# Patient Record
Sex: Male | Born: 1975 | Race: Black or African American | Hispanic: No | Marital: Married | Smoking: Current every day smoker
Health system: Southern US, Community
[De-identification: ages and names within clinical notes are randomized; demographics above are authoritative.]

## PROBLEM LIST (undated history)

## (undated) ENCOUNTER — Emergency Department (HOSPITAL_BASED_OUTPATIENT_CLINIC_OR_DEPARTMENT_OTHER): Payer: No Typology Code available for payment source

## (undated) DIAGNOSIS — J45909 Unspecified asthma, uncomplicated: Secondary | ICD-10-CM

---

## 2001-03-28 ENCOUNTER — Emergency Department (HOSPITAL_COMMUNITY): Admission: EM | Admit: 2001-03-28 | Discharge: 2001-03-28 | Payer: Self-pay

## 2004-02-13 ENCOUNTER — Emergency Department (HOSPITAL_COMMUNITY): Admission: EM | Admit: 2004-02-13 | Discharge: 2004-02-13 | Payer: Self-pay | Admitting: Emergency Medicine

## 2004-04-14 ENCOUNTER — Emergency Department (HOSPITAL_COMMUNITY): Admission: EM | Admit: 2004-04-14 | Discharge: 2004-04-14 | Payer: Self-pay | Admitting: Family Medicine

## 2004-10-30 ENCOUNTER — Emergency Department (HOSPITAL_COMMUNITY): Admission: EM | Admit: 2004-10-30 | Discharge: 2004-10-30 | Payer: Self-pay | Admitting: Emergency Medicine

## 2005-10-18 ENCOUNTER — Emergency Department (HOSPITAL_COMMUNITY): Admission: EM | Admit: 2005-10-18 | Discharge: 2005-10-19 | Payer: Self-pay | Admitting: Emergency Medicine

## 2007-06-20 ENCOUNTER — Emergency Department (HOSPITAL_COMMUNITY): Admission: EM | Admit: 2007-06-20 | Discharge: 2007-06-20 | Payer: Self-pay | Admitting: Family Medicine

## 2007-10-04 ENCOUNTER — Emergency Department (HOSPITAL_COMMUNITY): Admission: EM | Admit: 2007-10-04 | Discharge: 2007-10-04 | Payer: Self-pay | Admitting: Emergency Medicine

## 2009-06-08 ENCOUNTER — Emergency Department (HOSPITAL_COMMUNITY): Admission: EM | Admit: 2009-06-08 | Discharge: 2009-06-08 | Payer: Self-pay | Admitting: Emergency Medicine

## 2009-06-13 ENCOUNTER — Emergency Department (HOSPITAL_COMMUNITY): Admission: EM | Admit: 2009-06-13 | Discharge: 2009-06-13 | Payer: Self-pay | Admitting: Emergency Medicine

## 2011-11-06 ENCOUNTER — Emergency Department (INDEPENDENT_AMBULATORY_CARE_PROVIDER_SITE_OTHER)
Admission: EM | Admit: 2011-11-06 | Discharge: 2011-11-06 | Disposition: A | Discharge status: Home / Self Care | Payer: Self-pay | Source: Home / Self Care

## 2011-11-06 ENCOUNTER — Encounter (HOSPITAL_COMMUNITY): Payer: Self-pay | Admitting: *Deleted

## 2011-11-06 DIAGNOSIS — Z2089 Contact with and (suspected) exposure to other communicable diseases: Secondary | ICD-10-CM

## 2011-11-06 DIAGNOSIS — Z202 Contact with and (suspected) exposure to infections with a predominantly sexual mode of transmission: Secondary | ICD-10-CM

## 2011-11-06 MED ORDER — AZITHROMYCIN 1 G PO PACK: 1.0000 g | PACK | Freq: Once | ORAL | Status: DC

## 2011-11-06 MED ORDER — AZITHROMYCIN 250 MG PO TABS
ORAL_TABLET | ORAL | Status: AC
  Filled 2011-11-06: qty 4

## 2011-11-06 MED ORDER — AZITHROMYCIN 250 MG PO TABS
1000.0000 mg | ORAL_TABLET | Freq: Every day | ORAL | Status: DC
  Administered 2011-11-06: 1000 mg via ORAL

## 2011-11-06 NOTE — ED Provider Notes (Signed)
Medical screening examination/treatment/procedure(s) were performed by a resident physician and as supervising physician I was immediately available for consultation/collaboration.  Kelcey Wickstrom, M.D.   Laportia Carley C Leyton Brownlee, MD 11/06/11 2054 

## 2011-11-06 NOTE — ED Provider Notes (Signed)
Bradley Wilkins is a 36 y.o. male who presents to Urgent Care today for exposure to chlamydia. Patient's wife recently tested positive for Chlamydia and informed him that he used to be tested.  He feels well and is asymptomatic. He denies any discharge pain fevers chills abdominal pain back pain.  He may have a history of herpes however he's not sure. He denies any history of Chlamydia gonorrhea syphilis or HIV.   He feels well otherwise.  His last urine void was over one hour ago.   PMH reviewed.  History  Substance Use Topics  . Smoking status: Current Everyday Smoker  . Smokeless tobacco: Not on file  . Alcohol Use: Yes   ROS as above Medications reviewed. Current Facility-Administered Medications  Medication Dose Route Frequency Provider Last Rate Last Dose  . azithromycin (ZITHROMAX) powder 1 g  1 g Oral Once Rodolph Bong, MD       No current outpatient prescriptions on file.    Exam:  BP 115/86  Pulse 94  Temp 98.1 F (36.7 C) (Oral)  Resp 18  SpO2 99% Gen: Well NAD Genitals: Normal appearing circumcised penis without any lesions. Testicles are normal appearing and nontender. No masses or lymphadenopathy in the groin.   No results found for this or any previous visit (from the past 24 hour(s)). No results found.  Assessment and Plan: 36 y.o. male with exposure to chlamydia. Plan to do a urine test for gonorrhea and chlamydia as well as blood for HIV and RPR. Will treat empirically with azithromycin and followup as needed. Discussed the importance of being able to get in contact for any positive tests.     Rodolph Bong, MD 11/06/11 [phone]

## 2011-11-06 NOTE — ED Notes (Signed)
Pt  States  He  Was  Exposed  To  Std   He  denys      Any  Symptoms

## 2011-11-07 LAB — GC/CHLAMYDIA PROBE AMP, URINE
Chlamydia, Swab/Urine, PCR: NEGATIVE
GC Probe Amp, Urine: NEGATIVE

## 2011-11-17 ENCOUNTER — Emergency Department (HOSPITAL_COMMUNITY)
Admission: EM | Admit: 2011-11-17 | Discharge: 2011-11-17 | Disposition: A | Discharge status: Home / Self Care | Payer: Self-pay | Attending: Emergency Medicine | Admitting: Emergency Medicine

## 2011-11-17 ENCOUNTER — Encounter (HOSPITAL_COMMUNITY): Payer: Self-pay | Admitting: Emergency Medicine

## 2011-11-17 DIAGNOSIS — L738 Other specified follicular disorders: Secondary | ICD-10-CM | POA: Insufficient documentation

## 2011-11-17 DIAGNOSIS — L739 Follicular disorder, unspecified: Secondary | ICD-10-CM

## 2011-11-17 DIAGNOSIS — F17200 Nicotine dependence, unspecified, uncomplicated: Secondary | ICD-10-CM | POA: Insufficient documentation

## 2011-11-17 MED ORDER — DOXYCYCLINE HYCLATE 100 MG PO TABS
100.0000 mg | ORAL_TABLET | Freq: Once | ORAL | Status: AC
  Administered 2011-11-17: 100 mg via ORAL
  Filled 2011-11-17: qty 1

## 2011-11-17 MED ORDER — IBUPROFEN 400 MG PO TABS
800.0000 mg | ORAL_TABLET | Freq: Once | ORAL | Status: AC
  Administered 2011-11-17: 800 mg via ORAL
  Filled 2011-11-17: qty 2

## 2011-11-17 MED ORDER — NAPROXEN 500 MG PO TABS: 500.0000 mg | ORAL_TABLET | Freq: Two times a day (BID) | ORAL | Status: DC

## 2011-11-17 MED ORDER — DOXYCYCLINE HYCLATE 100 MG PO TABS: 100.0000 mg | ORAL_TABLET | Freq: Two times a day (BID) | ORAL | Status: AC
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
## 2011-11-17 NOTE — ED Provider Notes (Signed)
History   This chart was scribed for Bradley Kras, MD by Toya Smothers. The patient was seen in room TR06C/TR06C. Patient's care was started at 1150.  CSN: [id number]  Arrival date & time 11/17/11  1150   First MD Initiated Contact with Patient 11/17/11 1258      Chief Complaint  Patient presents with  . Abscess   The history is provided by the patient. No language interpreter was used.    Bradley Wilkins is a 36 y.o. male who typically healthy at baseline present to the ED with an an insect bite to the lower Left leg. Pt believes that he was bitten by a spider three days ago, and the pain has moderately worsened since onset. Pain is described as a waxing and waning stinging and burning. Pt denies fever, chills, nausea, emesis, and diarrhea.   History reviewed. No pertinent past medical history.  History reviewed. No pertinent past surgical history.  History reviewed. No pertinent family history.  History  Substance Use Topics  . Smoking status: Current Everyday Smoker  . Smokeless tobacco: Not on file  . Alcohol Use: Yes    Review of Systems  Constitutional: Negative for fever and chills.  HENT: Negative for rhinorrhea and neck pain.   Eyes: Negative for pain.  Respiratory: Negative for cough and shortness of breath.   Cardiovascular: Negative for chest pain.  Gastrointestinal: Negative for nausea, vomiting, abdominal pain and diarrhea.  Genitourinary: Negative for dysuria.  Musculoskeletal: Negative for back pain.  Skin: Positive for wound Glendon Axe). Negative for rash.  Neurological: Negative for dizziness and weakness.  All other systems reviewed and are negative.    Allergies  Review of patient's allergies indicates no known allergies.  Home Medications  No current outpatient prescriptions on file.  BP 121/62  Pulse 60  Temp 98.3 F (36.8 C) (Oral)  Resp 18  SpO2 97%  Physical Exam  Nursing note and vitals reviewed. Constitutional: He appears  well-developed and well-nourished. No distress.  HENT:  Head: Normocephalic and atraumatic.  Right Ear: External ear normal.  Left Ear: External ear normal.  Eyes: Conjunctivae are normal. Right eye exhibits no discharge. Left eye exhibits no discharge. No scleral icterus.  Neck: Neck supple. No tracheal deviation present.  Cardiovascular: Normal rate, regular rhythm and normal heart sounds.   No murmur heard. Pulmonary/Chest: Effort normal and breath sounds normal. No stridor. No respiratory distress.  Musculoskeletal: He exhibits no edema.       Right lower leg: He exhibits tenderness and swelling. He exhibits no bony tenderness.       Legs: Neurological: He is alert. Cranial nerve deficit: no gross deficits.  Skin: Skin is warm and dry. No rash noted.  Psychiatric: He has a normal mood and affect.    ED Course  Procedures (including critical care time) DIAGNOSTIC STUDIES: Oxygen Saturation is 97% on room air, normal by my interpretation.    COORDINATION OF CARE: 1306- Evaluated Pt. Pt is without distress.  RESULTS OF BEDSIDE ED ULTRASOUND  No sign of abscess., possible small area of fluid collection less than  0.25 mm in size . Labs Reviewed - No data to display No results found.   1. Folliculitis       MDM  Patient appears to have a folliculitis. No sign of drainable abscess. We'll discharge home on course of antibiotics and warm compresses.    I personally performed the services described in this documentation, which was scribed in my presence.  The recorded information has been reviewed and considered.      Bradley Kras, MD 11/17/11 1324

## 2012-03-02 ENCOUNTER — Encounter (HOSPITAL_COMMUNITY): Payer: Self-pay | Admitting: Emergency Medicine

## 2012-03-02 ENCOUNTER — Emergency Department (HOSPITAL_COMMUNITY)
Admission: EM | Admit: 2012-03-02 | Discharge: 2012-03-02 | Disposition: A | Discharge status: Home / Self Care | Payer: Self-pay | Attending: Emergency Medicine | Admitting: Emergency Medicine

## 2012-03-02 DIAGNOSIS — L02519 Cutaneous abscess of unspecified hand: Secondary | ICD-10-CM | POA: Insufficient documentation

## 2012-03-02 DIAGNOSIS — Y9389 Activity, other specified: Secondary | ICD-10-CM | POA: Insufficient documentation

## 2012-03-02 DIAGNOSIS — L03113 Cellulitis of right upper limb: Secondary | ICD-10-CM

## 2012-03-02 DIAGNOSIS — L089 Local infection of the skin and subcutaneous tissue, unspecified: Secondary | ICD-10-CM | POA: Insufficient documentation

## 2012-03-02 DIAGNOSIS — X58XXXA Exposure to other specified factors, initial encounter: Secondary | ICD-10-CM | POA: Insufficient documentation

## 2012-03-02 DIAGNOSIS — F17200 Nicotine dependence, unspecified, uncomplicated: Secondary | ICD-10-CM | POA: Insufficient documentation

## 2012-03-02 DIAGNOSIS — T148XXA Other injury of unspecified body region, initial encounter: Secondary | ICD-10-CM | POA: Insufficient documentation

## 2012-03-02 DIAGNOSIS — Y9289 Other specified places as the place of occurrence of the external cause: Secondary | ICD-10-CM | POA: Insufficient documentation

## 2012-03-02 MED ORDER — HYDROCODONE-ACETAMINOPHEN 5-325 MG PO TABS: 1.0000 | ORAL_TABLET | ORAL | Status: DC | PRN

## 2012-03-02 MED ORDER — CLINDAMYCIN HCL 150 MG PO CAPS
300.0000 mg | ORAL_CAPSULE | Freq: Once | ORAL | Status: AC
  Administered 2012-03-02: 300 mg via ORAL
  Filled 2012-03-02: qty 2

## 2012-03-02 MED ORDER — TETANUS-DIPHTH-ACELL PERTUSSIS 5-2.5-18.5 LF-MCG/0.5 IM SUSP
0.5000 mL | Freq: Once | INTRAMUSCULAR | Status: AC
  Administered 2012-03-02: 0.5 mL via INTRAMUSCULAR
  Filled 2012-03-02: qty 0.5

## 2012-03-02 MED ORDER — CLINDAMYCIN HCL 300 MG PO CAPS: 300.0000 mg | ORAL_CAPSULE | Freq: Four times a day (QID) | ORAL | Status: DC

## 2012-03-02 NOTE — ED Provider Notes (Signed)
History     CSN: [id number]  Arrival date & time 03/02/12  2035   First MD Initiated Contact with Patient 03/02/12 2123      Chief Complaint  Patient presents with  . Hand Pain   HPI  History provided by the patient. Patient is a 36 year old male with no significant PMH who presents with complaints of increased right hand pain and swelling. Patient states that he injured his right his hands one to 2 weeks ago while at work. Patient states he was unloading garbage into a dumpster and scraped the back of his right hand on a metal portions of the dumpster. He had a scrape over the proximal second and third metatarsal and carpal area as well as scrape and damage over the fifth MCP joint area. Patient reports having little problem with the hand over the past 3 or 4 days began having some swelling and redness. Patient has been trying to elevate and ice the hand with slight improvements but states symptoms have continued. He denies any erythematous streaks up the arm. Denies any reduced range of motion. He denies any weakness or numbness. Patient is unsure of his last tetanus shot. He denies any associated fever, chills or sweats.     History reviewed. No pertinent past medical history.  History reviewed. No pertinent past surgical history.  History reviewed. No pertinent family history.  History  Substance Use Topics  . Smoking status: Current Every Day Smoker  . Smokeless tobacco: Not on file  . Alcohol Use: Yes      Review of Systems  Constitutional: Negative for fever, chills and diaphoresis.  Neurological: Negative for weakness and numbness.  All other systems reviewed and are negative.    Allergies  Review of patient's allergies indicates no known allergies.  Home Medications  No current outpatient prescriptions on file.  BP 122/68  Pulse 104  Temp 99.3 F (37.4 C) (Oral)  Resp 18  SpO2 94%  Physical Exam  Nursing note and vitals reviewed. Constitutional: He is  oriented to person, place, and time. He appears well-developed and well-nourished. No distress.  HENT:  Head: Normocephalic.  Cardiovascular: Normal rate and regular rhythm.   Pulmonary/Chest: Effort normal and breath sounds normal.  Musculoskeletal: He exhibits edema and tenderness.       There is a well healing 4 cm abrasion/laceration over the dorsal proximal right hand near the carpal and metacarpal bones. There is a small puncture type laceration over the dorsal right fifth MCP joint area with some crusting and scabbing. There is a diffuse swelling over the dorsal hand with increased warmth and mild erythema. No gross deformities or bony tenderness. Normal distal sensation in fingers and cap refill. Normal range of motion of fingers and hand and wrist. No erythematous streaks.  Neurological: He is alert and oriented to person, place, and time.  Skin: Skin is warm. There is erythema.  Psychiatric: He has a normal mood and affect. His behavior is normal.    ED Course  Procedures       1. Wound infection   2. Cellulitis of right hand       MDM  9:30 PM patient seen and evaluated. Patient appears well in no acute distress. Patient does not appear toxic. No erythematous streaks up the arm. Patient afebrile. Patient has full range of motion of fingers and hand. No signs concerning for tenosynovitis.        Angus Seller, [place] 03/02/12 2158

## 2012-03-02 NOTE — ED Notes (Signed)
Patient reports that he cut the top of his his right hand yesterday on a metal dumpster at work.  Patient reports that since then, he has had pain and swelling of right right hand.  Healed abrasion/laceration noted to right hand.  Patient reports tenderness.  Able to move all digits without difficulty.  Unsure of last tetanus shot date.

## 2012-03-03 NOTE — ED Provider Notes (Signed)
Medical screening examination/treatment/procedure(s) were performed by non-physician practitioner and as supervising physician I was immediately available for consultation/collaboration.  Celene Kras, MD 03/03/12 [phone]

## 2012-09-03 ENCOUNTER — Encounter (HOSPITAL_COMMUNITY): Payer: Self-pay | Admitting: Adult Health

## 2012-09-03 DIAGNOSIS — F17200 Nicotine dependence, unspecified, uncomplicated: Secondary | ICD-10-CM | POA: Insufficient documentation

## 2012-09-03 DIAGNOSIS — Z23 Encounter for immunization: Secondary | ICD-10-CM | POA: Insufficient documentation

## 2012-09-03 DIAGNOSIS — S01501A Unspecified open wound of lip, initial encounter: Secondary | ICD-10-CM | POA: Insufficient documentation
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
## 2012-09-04 ENCOUNTER — Emergency Department (HOSPITAL_COMMUNITY)
Admission: EM | Admit: 2012-09-04 | Discharge: 2012-09-04 | Disposition: A | Discharge status: Home / Self Care | Payer: Self-pay | Attending: Emergency Medicine | Admitting: Emergency Medicine

## 2012-09-04 DIAGNOSIS — W503XXA Accidental bite by another person, initial encounter: Secondary | ICD-10-CM

## 2012-09-04 DIAGNOSIS — S01511A Laceration without foreign body of lip, initial encounter: Secondary | ICD-10-CM

## 2012-09-04 MED ORDER — TETANUS-DIPHTH-ACELL PERTUSSIS 5-2.5-18.5 LF-MCG/0.5 IM SUSP
0.5000 mL | Freq: Once | INTRAMUSCULAR | Status: AC
  Administered 2012-09-04: 0.5 mL via INTRAMUSCULAR
  Filled 2012-09-04: qty 0.5

## 2012-09-04 MED ORDER — AMOXICILLIN-POT CLAVULANATE 875-125 MG PO TABS
1.0000 | ORAL_TABLET | Freq: Once | ORAL | Status: AC
  Administered 2012-09-04: 1 via ORAL
  Filled 2012-09-04: qty 1

## 2012-09-04 MED ORDER — ONDANSETRON 4 MG PO TBDP
ORAL_TABLET | ORAL | Status: AC
  Administered 2012-09-04: 8 mg
  Filled 2012-09-04: qty 2

## 2012-09-04 MED ORDER — HYDROCODONE-ACETAMINOPHEN 5-325 MG PO TABS: 1.0000 | ORAL_TABLET | ORAL | Status: DC | PRN

## 2012-09-04 MED ORDER — AMOXICILLIN-POT CLAVULANATE 875-125 MG PO TABS: 1.0000 | ORAL_TABLET | Freq: Two times a day (BID) | ORAL | Status: DC
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
## 2012-09-07 NOTE — ED Provider Notes (Signed)
Medical screening examination/treatment/procedure(s) were conducted as a shared visit with non-physician practitioner(s) and myself.  I personally evaluated the patient during the encounter  See my additional note  Glynn Octave, MD 09/07/12 1747
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
## 2014-05-23 ENCOUNTER — Encounter (HOSPITAL_COMMUNITY): Payer: Self-pay | Admitting: *Deleted

## 2014-05-23 ENCOUNTER — Emergency Department (HOSPITAL_COMMUNITY)
Admission: EM | Admit: 2014-05-23 | Discharge: 2014-05-24 | Disposition: A | Discharge status: Home / Self Care | Payer: Self-pay | Attending: Emergency Medicine | Admitting: Emergency Medicine

## 2014-05-23 DIAGNOSIS — F1410 Cocaine abuse, uncomplicated: Secondary | ICD-10-CM

## 2014-05-23 DIAGNOSIS — Z720 Tobacco use: Secondary | ICD-10-CM | POA: Insufficient documentation

## 2014-05-23 DIAGNOSIS — F10920 Alcohol use, unspecified with intoxication, uncomplicated: Secondary | ICD-10-CM

## 2014-05-23 DIAGNOSIS — F1210 Cannabis abuse, uncomplicated: Secondary | ICD-10-CM | POA: Insufficient documentation

## 2014-05-23 DIAGNOSIS — J45909 Unspecified asthma, uncomplicated: Secondary | ICD-10-CM | POA: Insufficient documentation

## 2014-05-23 DIAGNOSIS — R4689 Other symptoms and signs involving appearance and behavior: Secondary | ICD-10-CM

## 2014-05-23 DIAGNOSIS — F329 Major depressive disorder, single episode, unspecified: Secondary | ICD-10-CM | POA: Insufficient documentation

## 2014-05-23 DIAGNOSIS — F10120 Alcohol abuse with intoxication, uncomplicated: Secondary | ICD-10-CM | POA: Insufficient documentation

## 2014-05-23 DIAGNOSIS — R4589 Other symptoms and signs involving emotional state: Secondary | ICD-10-CM

## 2014-05-23 DIAGNOSIS — F4325 Adjustment disorder with mixed disturbance of emotions and conduct: Secondary | ICD-10-CM

## 2014-05-23 DIAGNOSIS — F1010 Alcohol abuse, uncomplicated: Secondary | ICD-10-CM

## 2014-05-23 HISTORY — DX: Unspecified asthma, uncomplicated: J45.909

## 2014-05-23 LAB — COMPREHENSIVE METABOLIC PANEL
ALBUMIN: 4.5 g/dL (ref 3.5–5.2)
ALT: 27 U/L (ref 0–53)
ANION GAP: 20 — AB (ref 5–15)
AST: 43 U/L — AB (ref 0–37)
Alkaline Phosphatase: 90 U/L (ref 39–117)
BILIRUBIN TOTAL: 0.6 mg/dL (ref 0.3–1.2)
BUN: 13 mg/dL (ref 6–23)
CALCIUM: 9.6 mg/dL (ref 8.4–10.5)
CHLORIDE: 101 mmol/L (ref 96–112)
CO2: 17 mmol/L — ABNORMAL LOW (ref 19–32)
CREATININE: 1.16 mg/dL (ref 0.50–1.35)
GFR calc Af Amer: >90 mL/min (ref >90)
GFR calc non Af Amer: 78 mL/min — ABNORMAL LOW (ref >90)
Glucose, Bld: 71 mg/dL (ref 70–99)
POTASSIUM: 3.8 mmol/L (ref 3.5–5.1)
SODIUM: 138 mmol/L (ref 135–145)
Total Protein: 8.4 g/dL — ABNORMAL HIGH (ref 6.0–8.3)

## 2014-05-23 LAB — ACETAMINOPHEN LEVEL

## 2014-05-23 LAB — CBC
HCT: 45 % (ref 39.0–52.0)
Hemoglobin: 15.1 g/dL (ref 13.0–17.0)
MCH: 33 pg (ref 26.0–34.0)
MCHC: 33.6 g/dL (ref 30.0–36.0)
MCV: 98.5 fL (ref 78.0–100.0)
PLATELETS: 270 10*3/uL (ref 150–400)
RBC: 4.57 MIL/uL (ref 4.22–5.81)
RDW: 12.8 % (ref 11.5–15.5)
WBC: 4.6 10*3/uL (ref 4.0–10.5)

## 2014-05-23 LAB — RAPID URINE DRUG SCREEN, HOSP PERFORMED
AMPHETAMINES: NOT DETECTED
Barbiturates: NOT DETECTED
Benzodiazepines: NOT DETECTED
Cocaine: POSITIVE — AB
Opiates: NOT DETECTED
Tetrahydrocannabinol: POSITIVE — AB

## 2014-05-23 LAB — SALICYLATE LEVEL: Salicylate Lvl: 9.7 mg/dL (ref 2.8–20.0)

## 2014-05-23 LAB — ETHANOL: ALCOHOL ETHYL (B): 247 mg/dL — AB (ref 0–9)

## 2014-05-23 MED ORDER — LORAZEPAM 1 MG PO TABS: 0.0000 mg | ORAL_TABLET | Freq: Two times a day (BID) | ORAL | Status: DC

## 2014-05-23 MED ORDER — THIAMINE HCL 100 MG/ML IJ SOLN: 100.0000 mg | Freq: Every day | INTRAMUSCULAR | Status: DC

## 2014-05-23 MED ORDER — ACETAMINOPHEN 325 MG PO TABS: 650.0000 mg | ORAL_TABLET | ORAL | Status: DC | PRN

## 2014-05-23 MED ORDER — LORAZEPAM 1 MG PO TABS
0.0000 mg | ORAL_TABLET | Freq: Four times a day (QID) | ORAL | Status: DC
  Administered 2014-05-23: 1 mg via ORAL
  Filled 2014-05-23: qty 1

## 2014-05-23 MED ORDER — VITAMIN B-1 100 MG PO TABS
100.0000 mg | ORAL_TABLET | Freq: Every day | ORAL | Status: DC
  Administered 2014-05-23 – 2014-05-24 (×2): 100 mg via ORAL
  Filled 2014-05-23 (×2): qty 1

## 2014-05-23 MED ORDER — NICOTINE 21 MG/24HR TD PT24
21.0000 mg | MEDICATED_PATCH | Freq: Every day | TRANSDERMAL | Status: DC
  Administered 2014-05-23 – 2014-05-24 (×2): 21 mg via TRANSDERMAL
  Filled 2014-05-23: qty 1

## 2014-05-23 MED ORDER — IBUPROFEN 200 MG PO TABS: 600.0000 mg | ORAL_TABLET | Freq: Three times a day (TID) | ORAL | Status: DC | PRN
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
## 2014-05-23 NOTE — ED Notes (Signed)
Contact anytime. Pts grandmother Delorse LimberJoan Zeigler [phone]

## 2014-05-23 NOTE — ED Notes (Signed)
He has just calmly informed us he does not wish to see his wife for now.
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
## 2014-05-23 NOTE — ED Notes (Signed)
Patient denies SI, HI, AVH at present. States that he needs help with his drug and alcohol use. States "I don't want to hurt anybody. I've never hurt a woman". Patient reports anxiety related to being in the hospital.   Encouragement offered. Consent form signed.   Q 15 safety checks continue.

## 2014-05-23 NOTE — ED Notes (Signed)
Patient belongings: Black boots, hoodie, sweat shirt, sweat pants.

## 2014-05-24 DIAGNOSIS — F1410 Cocaine abuse, uncomplicated: Secondary | ICD-10-CM | POA: Diagnosis present

## 2014-05-24 DIAGNOSIS — F4325 Adjustment disorder with mixed disturbance of emotions and conduct: Secondary | ICD-10-CM | POA: Diagnosis present

## 2014-05-24 DIAGNOSIS — F1010 Alcohol abuse, uncomplicated: Secondary | ICD-10-CM

## 2014-05-24 HISTORY — DX: Cocaine abuse, uncomplicated: F14.10

## 2014-05-24 HISTORY — DX: Alcohol abuse, uncomplicated: F10.10

## 2014-05-24 NOTE — [facility] Suicide Risk Assessment (Signed)
Suicide Risk Assessment  Discharge Assessment   [facility]   Demographic Factors:  Male  Total Time spent with patient: 45 minutes Musculoskeletal: Strength & Muscle Tone: within normal limits Gait & Station: normal Patient leans: N/A  Psychiatric Specialty Exam:     Blood pressure 111/64, pulse 78, temperature 98.1 F (36.7 C), temperature source Oral, resp. rate 18, SpO2 98 %.There is no height or weight on file to calculate BMI.  General Appearance: Casual  Eye Contact::  Good  Speech:  Normal Rate  Volume:  Normal  Mood:  Euthymic  Affect:  Congruent  Thought Process:  Coherent  Orientation:  Full (Time, Place, and Person)  Thought Content:  WDL  Suicidal Thoughts:  No  Homicidal Thoughts:  No  Memory:  Immediate;   Good Recent;   Good Remote;   Good  Judgement:  Fair  Insight:  Fair  Psychomotor Activity:  Normal  Concentration:  Good  Recall:  Good  Fund of Knowledge:Good  Language: Good  Akathisia:  No  Handed:  Right  AIMS (if indicated):     Assets:  Desire for Improvement Financial Resources/Insurance Housing Intimacy Leisure Time Physical Health Resilience Social Support Talents/Skills Vocational/Educational  ADL's:  Intact  Cognition: WNL  Sleep:       Has this patient used any form of tobacco in the last 30 days? (Cigarettes, Smokeless Tobacco, Cigars, and/or Pipes) Yes, A prescription for an FDA-approved tobacco cessation medication was offered at discharge and the patient refused  Mental Status Per Nursing Assessment::   On Admission:   Alcohol and cocaine abuse  Current Mental Status by Physician: NA  Loss Factors: NA  Historical Factors: NA  Risk Reduction Factors:   Responsible for children under 39 years of age, Sense of responsibility to family, Employed, Living with another person, especially a relative and Positive social support  Continued Clinical Symptoms:  None  Cognitive Features That  Contribute To Risk:  None    Suicide Risk:  Minimal: No identifiable suicidal ideation.  Patients presenting with no risk factors but with morbid ruminations; may be classified as minimal risk based on the severity of the depressive symptoms  Principal Problem: Adjustment disorder with mixed disturbance of emotions and conduct Discharge Diagnoses:  Patient Active Problem List   Diagnosis Date Noted  . Alcohol abuse [F10.10] 05/24/2014    Priority: High  . Cocaine abuse [F14.10] 05/24/2014    Priority: High  . Adjustment disorder with mixed disturbance of emotions and conduct [F43.25] 05/24/2014    Priority: High      Plan Of Care/Follow-up recommendations:  Activity:  as tolerated Diet:  heart healthy diet  Is patient on multiple antipsychotic therapies at discharge:  No   Has Patient had three or more failed trials of antipsychotic monotherapy by history:  No  Recommended Plan for Multiple Antipsychotic Therapies: NA    Haydin Calandra, PMH-NP 05/24/2014, 11:35 AM
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
## 2014-05-24 NOTE — ED Notes (Signed)
Pt is awake and alert, pleasant and cooperative. Patient denies HI, SI AH or VH. Discharge vitals 124/89 HR 88 RR 16 and unlabored. Pt was given out patient resources   scheduled. Will continue to monitor for safety. Patient escorted to lobby without incident. T.Melvyn NethLewis RN

## 2015-03-18 ENCOUNTER — Emergency Department (HOSPITAL_COMMUNITY)
Admission: EM | Admit: 2015-03-18 | Discharge: 2015-03-18 | Disposition: A | Discharge status: Home / Self Care | Payer: Self-pay | Attending: Emergency Medicine | Admitting: Emergency Medicine

## 2015-03-18 ENCOUNTER — Encounter (HOSPITAL_COMMUNITY): Payer: Self-pay

## 2015-03-18 DIAGNOSIS — Z792 Long term (current) use of antibiotics: Secondary | ICD-10-CM | POA: Insufficient documentation

## 2015-03-18 DIAGNOSIS — F1010 Alcohol abuse, uncomplicated: Secondary | ICD-10-CM | POA: Insufficient documentation

## 2015-03-18 DIAGNOSIS — R454 Irritability and anger: Secondary | ICD-10-CM | POA: Insufficient documentation

## 2015-03-18 DIAGNOSIS — F17200 Nicotine dependence, unspecified, uncomplicated: Secondary | ICD-10-CM | POA: Insufficient documentation

## 2015-03-18 DIAGNOSIS — F1410 Cocaine abuse, uncomplicated: Secondary | ICD-10-CM | POA: Insufficient documentation

## 2015-03-18 DIAGNOSIS — J45909 Unspecified asthma, uncomplicated: Secondary | ICD-10-CM | POA: Insufficient documentation

## 2015-03-18 LAB — RAPID URINE DRUG SCREEN, HOSP PERFORMED
Amphetamines: NOT DETECTED
Barbiturates: NOT DETECTED
Benzodiazepines: NOT DETECTED
COCAINE: POSITIVE — AB
OPIATES: NOT DETECTED
Tetrahydrocannabinol: NOT DETECTED

## 2015-03-18 LAB — CBC
HCT: 47.2 % (ref 39.0–52.0)
Hemoglobin: 16.2 g/dL (ref 13.0–17.0)
MCH: 33.1 pg (ref 26.0–34.0)
MCHC: 34.3 g/dL (ref 30.0–36.0)
MCV: 96.3 fL (ref 78.0–100.0)
PLATELETS: 232 10*3/uL (ref 150–400)
RBC: 4.9 MIL/uL (ref 4.22–5.81)
RDW: 12.1 % (ref 11.5–15.5)
WBC: 5.1 10*3/uL (ref 4.0–10.5)

## 2015-03-18 LAB — ETHANOL: ALCOHOL ETHYL (B): 277 mg/dL — AB (ref <5)

## 2015-03-18 LAB — COMPREHENSIVE METABOLIC PANEL
ALT: 40 U/L (ref 17–63)
AST: 45 U/L — ABNORMAL HIGH (ref 15–41)
Albumin: 4.9 g/dL (ref 3.5–5.0)
Alkaline Phosphatase: 90 U/L (ref 38–126)
Anion gap: 14 (ref 5–15)
BUN: 10 mg/dL (ref 6–20)
CHLORIDE: 101 mmol/L (ref 101–111)
CO2: 26 mmol/L (ref 22–32)
Calcium: 9.7 mg/dL (ref 8.9–10.3)
Creatinine, Ser: 1.05 mg/dL (ref 0.61–1.24)
Glucose, Bld: 81 mg/dL (ref 65–99)
POTASSIUM: 3.8 mmol/L (ref 3.5–5.1)
Sodium: 141 mmol/L (ref 135–145)
TOTAL PROTEIN: 8.4 g/dL — AB (ref 6.5–8.1)
Total Bilirubin: 0.9 mg/dL (ref 0.3–1.2)

## 2015-03-18 LAB — SALICYLATE LEVEL: Salicylate Lvl: <4 mg/dL (ref 2.8–30.0)

## 2015-03-18 LAB — ACETAMINOPHEN LEVEL

## 2015-03-18 NOTE — Discharge Instructions (Signed)
[place] in the [facility]  Intensive Outpatient Programs: [facility]      [place]. [place], [place] Both a day and evening program       [facility] Outpatient     [address]        [place], [place] [address] [phone]         ADS: Alcohol & Drug Svcs [place]: [phone] or [phone] [address], [place] [address] [email or web address]   Substance Abuse Resources: - Alcohol and Drug Services  [phone] - [facility] [phone] - The [place] [phone] - Residential & Outpatient Substance Abuse Program  [phone]  Psychological Services: - [place]  [employer]  [place], [phone] [place]. [address] [place], [place]: [phone] or [phone], [email or web address]  Mobile Crisis Teams:                                        [facility]         Mobile Crisis Care Unit [phone]             [facility] [place] [address] Lady [place] [address] [place] [phone]  Self-Help/Support Groups: [place]. of [employer] of support groups [phone] (call for more info)  Narcotics Anonymous (NA) Caring [facility] [address] [place] - 2 meetings at this location  Residential Treatment Programs:  [place]       [place] [address], [place], [place]  [address] [place]  [address] [place], [place] [address] [phone] Admissions: 8am-3pm M-F  Incentives Substance [place]     [address] [place], [place] [address]       [phone]         [facility] [address] Jadene Pierini [place], [place]  [facility] [address] [place], [place]  [facility] - Intensive Outpatient      [address]     [place], [place]         [facility] ([place]     [place], [place] or [phone]  Residential Treatment Services (RTS), Medicaid [place], [place]  [facility] [address]                                               [address] [place] [facility] Resources: [place]  Human Services- [phone]               General Therapy                                                Domenic Schwab, PhD        [address] [place], [place] [address]         [facility]   [address] [place], [place] [address] [phone]  [facility] [address] [place], [place] [address] [phone] Insurance/Medicaid/sponsorship through [facility] and Families                                              [address] [place]                                        [place], [place] [address]    Therapy/tele-psych/case         [place], [place]  [address]  Adolescent/group home/case management [phone]                                           Rosette Reveal PhD       General therapy       Insurance   [phone]         Dr. Adele Schilder, Insurance, M-F [phone]  [facility] Dept. [address]          [address] [place] Phone:  [phone]                                  Phone:  [phone]                   Phone:  [place], [place], [address]  [phone], Insurance  Stimulant Use Disorder-Cocaine Cocaine is one of a group of powerful drugs called stimulants. Cocaine has medical uses for stopping nosebleeds and for pain control before minor nose or dental surgery. However, cocaine is misused because of the effects that it produces. These effects include:   A feeling of extreme pleasure.  Alertness.  High energy. Common street names for cocaine include coke, crack, blow, snow, and nose candy. Cocaine is snorted, dissolved in water and injected, or smoked.  Stimulants are addictive because they activate regions of the brain that produce both the pleasurable sensation of "reward" and psychological dependence. Together, these actions account for loss of control and the rapid development of drug dependence. This means you become ill without the drug (withdrawal) and need to keep using it to function.  Stimulant use disorder is use of stimulants that disrupts your daily life. It disrupts relationships with family and friends and how you do your job. Cocaine increases your blood pressure and heart rate. It can cause a heart attack or stroke. Cocaine can also cause death from irregular heart rate or seizures. SYMPTOMS Symptoms of stimulant use disorder with cocaine include:  Use of cocaine in larger amounts or over a longer period of time than intended.  Unsuccessful attempts to cut down or control cocaine use.  A lot of time spent obtaining, using, or recovering from the effects of cocaine.  A strong  desire or urge to use cocaine (craving).  Continued use of cocaine in spite of major problems at work, school, or home because of use.  Continued use of cocaine in spite of relationship problems because of use.  Giving up or cutting down on important life activities because of cocaine use.  Use of cocaine over and over in situations when it is physically hazardous, such as driving a car.  Continued use of cocaine in spite of a physical problem that is likely related to use. Physical problems can include:  Malnutrition.  Nosebleeds.  Chest pain.  High blood pressure.  A hole that develops between the part of your nose that separates your nostrils (perforated nasal septum).  Lung and kidney damage.  Continued use of cocaine in spite of a mental problem that is likely related to use. Mental problems can include:  Schizophrenia-like symptoms.  Depression.  Bipolar mood swings.  Anxiety.  Sleep problems.  Need to use more and more cocaine to get the same effect, or lessened effect over time with use of the same amount of cocaine (tolerance).  Having withdrawal symptoms when cocaine use is stopped, or using cocaine to reduce or avoid withdrawal symptoms. Withdrawal symptoms include:  Depressed or irritable mood.  Low energy or restlessness.  Bad dreams.  Poor or excessive sleep.  Increased appetite. DIAGNOSIS Stimulant use disorder is diagnosed by your health care provider. You may be asked questions about your cocaine use and how it affects your life. A physical exam may be done. A drug screen may be ordered. You may be referred to a mental health professional. The diagnosis of stimulant use disorder requires at least two symptoms within 12 months. The type of stimulant use disorder depends on the number of signs and symptoms you have. The type may be:  Mild. Two or three signs and symptoms.  Moderate. Four or five signs and symptoms.  Severe. Six or more signs and  symptoms. TREATMENT Treatment for stimulant use disorder is usually provided by mental health professionals with training in substance use disorders. The  following options are available:  Counseling or talk therapy. Talk therapy addresses the reasons you use cocaine and ways to keep you from using again. Goals of talk therapy include:  Identifying and avoiding triggers for use.  Handling cravings.  Replacing use with healthy activities.  Support groups. Support groups provide emotional support, advice, and guidance.  Medicine. Certain medicines may decrease cocaine cravings or withdrawal symptoms. HOME CARE INSTRUCTIONS  Take medicines only as directed by your health care provider.  Identify the people and activities that trigger your cocaine use and avoid them.  Keep all follow-up visits as directed by your health care provider. SEEK MEDICAL CARE IF:  Your symptoms get worse or you relapse.  You are not able to take medicines as directed. SEEK IMMEDIATE MEDICAL CARE IF:  You have serious thoughts about hurting yourself or others.  You have a seizure, chest pain, sudden weakness, or loss of speech or vision. FOR MORE INFORMATION  National Institute on Drug Abuse: http://www.price-smith.com/  Substance Abuse and Mental Health Services Administration: SkateOasis.com.pt   This information is not intended to replace advice given to you by your health care provider. Make sure you discuss any questions you have with your health care provider.   Document Released: 03/24/2000 Document Revised: 04/17/2014 Document Reviewed: 04/09/2013 Elsevier Interactive Patient Education [employer]

## 2015-03-18 NOTE — ED Provider Notes (Signed)
CSN: [id number]     Arrival date & time 03/18/15  1526 History   First MD Initiated Contact with Patient 03/18/15 1601     Chief Complaint  Patient presents with  . Addiction Problem  . Suicidal     (Consider location/radiation/quality/duration/timing/severity/associated sxs/prior Treatment) Patient is a 39 y.o. male presenting with mental health disorder. The history is provided by the patient.  Mental Health Problem Presenting symptoms: suicidal thoughts and suicidal threats   Patient accompanied by:  Family member Degree of incapacity (severity):  Moderate Onset quality:  Sudden Duration:  2 days Timing:  Constant Progression:  Unchanged Chronicity:  New Treatment compliance:  Untreated Relieved by:  Nothing Worsened by:  Nothing tried Ineffective treatments:  None tried Associated symptoms: no abdominal pain, no chest pain and no headaches     39 yo M with a chief complaints of suicidal ideation. On my exam patient denying such complaints. States that he is just frustrated today he is trying to get help to stop cocaine and alcohol abuse. Denies ever saying that he was going to jump off a bridge. States that the nurse made that up on triage. Feels that he is safe now agrees that he will go home with his family and seek outpatient follow-up. Denies any chest pain shortness breath fevers chills.  Past Medical History  Diagnosis Date  . Asthma    History reviewed. No pertinent past surgical history. History reviewed. No pertinent family history. Social History  Substance Use Topics  . Smoking status: Current Every Day Smoker  . Smokeless tobacco: None  . Alcohol Use: Yes    Review of Systems  Constitutional: Negative for fever and chills.  HENT: Negative for congestion and facial swelling.   Eyes: Negative for discharge and visual disturbance.  Respiratory: Negative for shortness of breath.   Cardiovascular: Negative for chest pain and palpitations.  Gastrointestinal:  Negative for vomiting, abdominal pain and diarrhea.  Musculoskeletal: Negative for myalgias and arthralgias.  Skin: Negative for color change and rash.  Neurological: Negative for tremors, syncope and headaches.  Psychiatric/Behavioral: Positive for suicidal ideas. Negative for confusion and dysphoric mood.      Allergies  Review of patient's allergies indicates no known allergies.  Home Medications   Prior to Admission medications   Medication Sig Start Date End Date Taking? Authorizing Provider  amoxicillin-clavulanate (AUGMENTIN) 875-125 MG per tablet Take 1 tablet by mouth every 12 (twelve) hours. 09/04/12   Elpidio AnisShari Upstill, PA-C  HYDROcodone-acetaminophen (NORCO/VICODIN) 5-325 MG per tablet Take 1 tablet by mouth every 4 (four) hours as needed for pain. 09/04/12   Shari Upstill, PA-C   BP 123/80 mmHg  Pulse 93  Temp(Src) 97.7 F (36.5 C) (Oral)  Resp 22  SpO2 98% Physical Exam  Constitutional: He is oriented to person, place, and time. He appears well-developed and well-nourished.  HENT:  Head: Normocephalic and atraumatic.  Eyes: EOM are normal. Pupils are equal, round, and reactive to light.  Neck: Normal range of motion. Neck supple. No JVD present.  Cardiovascular: Normal rate and regular rhythm.  Exam reveals no gallop and no friction rub.   No murmur heard. Pulmonary/Chest: No respiratory distress. He has no wheezes.  Abdominal: He exhibits no distension. There is no rebound and no guarding.  Musculoskeletal: Normal range of motion.  Neurological: He is alert and oriented to person, place, and time.  Skin: No rash noted. No pallor.  Psychiatric: His behavior is normal. His affect is angry. He does not exhibit  a depressed mood. He expresses no homicidal and no suicidal ideation. He expresses no suicidal plans and no homicidal plans.  Nursing note and vitals reviewed.   ED Course  Procedures (including critical care time) Labs Review Labs Reviewed  COMPREHENSIVE  METABOLIC PANEL - Abnormal; Notable for the following:    Total Protein 8.4 (*)    AST 45 (*)    All other components within normal limits  ETHANOL - Abnormal; Notable for the following:    Alcohol, Ethyl (B) 277 (*)    All other components within normal limits  ACETAMINOPHEN LEVEL - Abnormal; Notable for the following:    Acetaminophen (Tylenol), Serum <10 (*)    All other components within normal limits  URINE RAPID DRUG SCREEN, HOSP PERFORMED - Abnormal; Notable for the following:    Cocaine POSITIVE (*)    All other components within normal limits  SALICYLATE LEVEL  CBC    Imaging Review No results found. I have personally reviewed and evaluated these images and lab results as part of my medical decision-making.   EKG Interpretation None      MDM   Final diagnoses:  Alcohol abuse  Cocaine abuse    39 yo M with a chief complaint of requesting detox. Upon finding out that it happens and outpatient patient is happy to go there. Was given a list of possible resources. Patient states that he is not actively suicidal or homicidal. Not actively withdrawing. Family agrees that they will take care of him at home. He will follow-up tomorrow morning.   9:28 PM:  I have discussed the diagnosis/risks/treatment options with the patient and family and believe the pt to be eligible for discharge home to follow-up with PCP. We also discussed returning to the ED immediately if new or worsening sx occur. We discussed the sx which are most concerning (e.g., sudden worsening depression) that necessitate immediate return. Medications administered to the patient during their visit and any new prescriptions provided to the patient are listed below.  Medications given during this visit Medications - No data to display  Discharge Medication List as of 03/18/2015  4:37 PM      The patient appears reasonably screen and/or stabilized for discharge and I doubt any other medical condition or other  [facility] requiring further screening, evaluation, or treatment in the ED at this time prior to discharge.      Melene Plan, DO 03/18/15 2129

## 2015-03-18 NOTE — ED Notes (Addendum)
PT belonging at nurse desk: blk shoe, blk shirt, blk pants, blk wallet,visa ending in 0355, one $20 bill,  driving license,

## 2015-03-18 NOTE — ED Notes (Signed)
Pt very flat, sad on assessment. Pt not giving many answers to RN questions.

## 2015-03-18 NOTE — ED Notes (Signed)
Pt info me he is not taking off under shirt because he is too cold.

## 2015-03-18 NOTE — ED Notes (Signed)
Per EMS, pt from home.  Pt c/o cocaine and alcohol abuse.  Pt also with SI thoughts.  Talking about jumping off bridge.  Cut self. Vitals: 128/90, hr 106, resp16, 98%, cbg 96

## 2015-09-25 ENCOUNTER — Emergency Department (HOSPITAL_COMMUNITY)

## 2015-09-25 ENCOUNTER — Emergency Department (HOSPITAL_COMMUNITY)
Admission: EM | Admit: 2015-09-25 | Discharge: 2015-09-26 | Disposition: A | Discharge status: Home / Self Care | Attending: Emergency Medicine | Admitting: Emergency Medicine

## 2015-09-25 ENCOUNTER — Encounter (HOSPITAL_COMMUNITY): Payer: Self-pay | Admitting: Emergency Medicine

## 2015-09-25 DIAGNOSIS — J45909 Unspecified asthma, uncomplicated: Secondary | ICD-10-CM | POA: Diagnosis not present

## 2015-09-25 DIAGNOSIS — F17200 Nicotine dependence, unspecified, uncomplicated: Secondary | ICD-10-CM | POA: Insufficient documentation

## 2015-09-25 DIAGNOSIS — Y929 Unspecified place or not applicable: Secondary | ICD-10-CM | POA: Insufficient documentation

## 2015-09-25 DIAGNOSIS — F1290 Cannabis use, unspecified, uncomplicated: Secondary | ICD-10-CM | POA: Diagnosis not present

## 2015-09-25 DIAGNOSIS — W1789XA Other fall from one level to another, initial encounter: Secondary | ICD-10-CM | POA: Insufficient documentation

## 2015-09-25 DIAGNOSIS — S82431A Displaced oblique fracture of shaft of right fibula, initial encounter for closed fracture: Secondary | ICD-10-CM | POA: Diagnosis not present

## 2015-09-25 DIAGNOSIS — Y939 Activity, unspecified: Secondary | ICD-10-CM | POA: Insufficient documentation

## 2015-09-25 DIAGNOSIS — S82401A Unspecified fracture of shaft of right fibula, initial encounter for closed fracture: Secondary | ICD-10-CM

## 2015-09-25 DIAGNOSIS — S299XXA Unspecified injury of thorax, initial encounter: Secondary | ICD-10-CM | POA: Diagnosis present

## 2015-09-25 DIAGNOSIS — Y999 Unspecified external cause status: Secondary | ICD-10-CM | POA: Insufficient documentation

## 2015-09-25 NOTE — ED Notes (Signed)
Ortho tech at bedside 
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
## 2015-09-26 MED ORDER — BACITRACIN ZINC 500 UNIT/GM EX OINT
TOPICAL_OINTMENT | CUTANEOUS | Status: AC
  Filled 2015-09-26: qty 1.8

## 2015-09-26 MED ORDER — HYDROCODONE-ACETAMINOPHEN 5-325 MG PO TABS
1.0000 | ORAL_TABLET | Freq: Once | ORAL | Status: AC
  Administered 2015-09-26: 1 via ORAL
  Filled 2015-09-26: qty 1

## 2015-09-26 MED ORDER — NAPROXEN 375 MG PO TABS: 375.0000 mg | ORAL_TABLET | Freq: Two times a day (BID) | ORAL | Status: DC

## 2015-09-26 NOTE — Discharge Instructions (Signed)
PATIENT NEEDS TO BE GIVEN NAPROXEN 1 TABLET 2 TIMES DAILY FOR PAIN WITH FOOD 12 HOURS APART. HE SHOULD BE GIVEN ICE PACKS FOR 30 MINUTES AT A TIME 5 TIMES DAILY FOR SWELLING FOR THE NEXT 7 DAYS. THE PATIENT MUST BE ALLOWED TO RECLINE WITH HIS LEG ELEVATED ABOVE HIS HEART DURING THESE PERIODS AND AS FREQUENTLY AS HE NEED TO HELP REDUCE THE SWELLING. HE ABSOLUTELY MUST FOLLOW UP WITH DR. Ranell PatrickNORRIS AT [facility] IN 1 WEEK. THIS IS CRITICAL! HE IS NOT ALLOWED TO BEAR ANY WEIGHT ON THE ANKLE WHATSOEVER AND MUST USE CRUTCHES AT ALL TIMES WHEN STANDING OR WALKING. HE MAY NEED ASSISTANCE WITH BATHING AS HE WILL NOT BE ABLE TO WET THE SPLINT AND CANNOT STAND FOR SHOWERS. IF THE PATIENT BEGINS TO COMPLAIN OF SEVERE PAIN OUT OF PROPORTION TO HIS INJURY, NUMBNESS AND TINGLING IN THE FOOT, WHITE DISCOLORATION OF THE TOES, SEVERE LEG SWELLING, CALF SWELLING, FEVERS, OR CHEST PAIN AND SHORTNESS OF BREATH HE MUST BE BROUGHT IMMEDIATELY TO THE EMERGENCY DEPARTMENT.   Fibular Ankle Fracture Treated With or Without Immobilization, Adult A fibular fracture at your ankle is a break (fracture) bone in the smallest of the two bones in your lower leg, located on the outside of your leg (fibula) close to the area at your ankle joint. CAUSES  Rolling your ankle.  Twisting your ankle.  Extreme flexing or extending of your foot.  Severe force on your ankle as when falling from a distance. RISK FACTORS  Jumping activities.  Participation in sports.  Osteoporosis.  Advanced age.  Previous ankle injuries. SIGNS AND SYMPTOMS  Pain.  Swelling.  Inability to put weight on injured ankle.  Bruising.  Bone deformities at site of injury. DIAGNOSIS  This fracture is diagnosed with the help of an X-ray exam. TREATMENT  If the fractured bone did not move out of place it usually will heal without problems and does casting or splinting. If immobilization is needed for comfort or the fractured bone moved  out of place and will not heal properly with immobilization, a cast or splint will be used. HOME CARE INSTRUCTIONS   Apply ice to the area of injury:  Put ice in a plastic bag.  Place a towel between your skin and the bag.  Leave the ice on for 20 minutes, 2-3 times a day.  Use crutches as directed. Resume walking without crutches as directed by your health care provider.  Only take over-the-counter or prescription medicines for pain, discomfort, or fever as directed by your health care provider.  If you have a removable splint or boot, do not remove the boot unless directed by your health care provider. SEEK MEDICAL CARE IF:   You have continued pain or more swelling  The medications do not control the pain. SEEK IMMEDIATE MEDICAL CARE IF:  You develop severe pain in the leg or foot.  Your skin or nails below the injury turn blue or grey or feel cold or numb. MAKE SURE YOU:   Understand these instructions.  Will watch your condition.  Will get help right away if you are not doing well or get worse.   This information is not intended to replace advice given to you by your health care provider. Make sure you discuss any questions you have with your health care provider.   Document Released: 03/27/2005 Document Revised: 04/17/2014 Document Reviewed: 11/06/2012 Elsevier Interactive Patient Education 2016 Elsevier Inc. Cast or Splint Care Casts and splints support injured limbs and  keep bones from moving while they heal. It is important to care for your cast or splint at home.  HOME CARE INSTRUCTIONS  Keep the cast or splint uncovered during the drying period. It can take 24 to 48 hours to dry if it is made of plaster. A fiberglass cast will dry in less than 1 hour.  Do not rest the cast on anything harder than a pillow for the first 24 hours.  Do not put weight on your injured limb or apply pressure to the cast until your health care provider gives you permission.  Keep  the cast or splint dry. Wet casts or splints can lose their shape and may not support the limb as well. A wet cast that has lost its shape can also create harmful pressure on your skin when it dries. Also, wet skin can become infected.  Cover the cast or splint with a plastic bag when bathing or when out in the rain or snow. If the cast is on the trunk of the body, take sponge baths until the cast is removed.  If your cast does become wet, dry it with a towel or a blow dryer on the cool setting only.  Keep your cast or splint clean. Soiled casts may be wiped with a moistened cloth.  Do not place any hard or soft foreign objects under your cast or splint, such as cotton, toilet paper, lotion, or powder.  Do not try to scratch the skin under the cast with any object. The object could get stuck inside the cast. Also, scratching could lead to an infection. If itching is a problem, use a blow dryer on a cool setting to relieve discomfort.  Do not trim or cut your cast or remove padding from inside of it.  Exercise all joints next to the injury that are not immobilized by the cast or splint. For example, if you have a long leg cast, exercise the hip joint and toes. If you have an arm cast or splint, exercise the shoulder, elbow, thumb, and fingers.  Elevate your injured arm or leg on 1 or 2 pillows for the first 1 to 3 days to decrease swelling and pain.It is best if you can comfortably elevate your cast so it is higher than your heart. SEEK MEDICAL CARE IF:   Your cast or splint cracks.  Your cast or splint is too tight or too loose.  You have unbearable itching inside the cast.  Your cast becomes wet or develops a soft spot or area.  You have a bad smell coming from inside your cast.  You get an object stuck under your cast.  Your skin around the cast becomes red or raw.  You have new pain or worsening pain after the cast has been applied. SEEK IMMEDIATE MEDICAL CARE IF:   You have  fluid leaking through the cast.  You are unable to move your fingers or toes.  You have discolored (blue or white), cool, painful, or very swollen fingers or toes beyond the cast.  You have tingling or numbness around the injured area.  You have severe pain or pressure under the cast.  You have any difficulty with your breathing or have shortness of breath.  You have chest pain.   This information is not intended to replace advice given to you by your health care provider. Make sure you discuss any questions you have with your health care provider.   Document Released: 03/24/2000 Document Revised: 01/15/2013 Document  Reviewed: 10/03/2012 Elsevier Interactive Patient Education [employer]
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
## 2017-02-22 ENCOUNTER — Other Ambulatory Visit: Payer: Self-pay

## 2017-02-22 ENCOUNTER — Emergency Department (HOSPITAL_COMMUNITY)

## 2017-02-22 ENCOUNTER — Encounter (HOSPITAL_COMMUNITY): Payer: Self-pay | Admitting: *Deleted

## 2017-02-22 ENCOUNTER — Emergency Department (HOSPITAL_COMMUNITY)
Admission: EM | Admit: 2017-02-22 | Discharge: 2017-02-22 | Disposition: A | Discharge status: Home / Self Care | Attending: Emergency Medicine | Admitting: Emergency Medicine

## 2017-02-22 DIAGNOSIS — F1210 Cannabis abuse, uncomplicated: Secondary | ICD-10-CM | POA: Insufficient documentation

## 2017-02-22 DIAGNOSIS — M5412 Radiculopathy, cervical region: Secondary | ICD-10-CM

## 2017-02-22 DIAGNOSIS — F17200 Nicotine dependence, unspecified, uncomplicated: Secondary | ICD-10-CM | POA: Insufficient documentation

## 2017-02-22 DIAGNOSIS — F1410 Cocaine abuse, uncomplicated: Secondary | ICD-10-CM | POA: Insufficient documentation

## 2017-02-22 DIAGNOSIS — Z79899 Other long term (current) drug therapy: Secondary | ICD-10-CM | POA: Insufficient documentation

## 2017-02-22 DIAGNOSIS — M62830 Muscle spasm of back: Secondary | ICD-10-CM | POA: Insufficient documentation

## 2017-02-22 DIAGNOSIS — F4325 Adjustment disorder with mixed disturbance of emotions and conduct: Secondary | ICD-10-CM | POA: Insufficient documentation

## 2017-02-22 LAB — BASIC METABOLIC PANEL
ANION GAP: 10 (ref 5–15)
BUN: 14 mg/dL (ref 6–20)
CO2: 22 mmol/L (ref 22–32)
Calcium: 9.6 mg/dL (ref 8.9–10.3)
Chloride: 104 mmol/L (ref 101–111)
Creatinine, Ser: 1.25 mg/dL — ABNORMAL HIGH (ref 0.61–1.24)
Glucose, Bld: 96 mg/dL (ref 65–99)
POTASSIUM: 3.8 mmol/L (ref 3.5–5.1)
SODIUM: 136 mmol/L (ref 135–145)

## 2017-02-22 LAB — I-STAT TROPONIN, ED: TROPONIN I, POC: 0 ng/mL (ref 0.00–0.08)

## 2017-02-22 LAB — CBC
HEMATOCRIT: 42 % (ref 39.0–52.0)
HEMOGLOBIN: 14.2 g/dL (ref 13.0–17.0)
MCH: 31.8 pg (ref 26.0–34.0)
MCHC: 33.8 g/dL (ref 30.0–36.0)
MCV: 94.2 fL (ref 78.0–100.0)
Platelets: 223 10*3/uL (ref 150–400)
RBC: 4.46 MIL/uL (ref 4.22–5.81)
RDW: 12 % (ref 11.5–15.5)
WBC: 4.2 10*3/uL (ref 4.0–10.5)

## 2017-02-22 MED ORDER — METHOCARBAMOL 500 MG PO TABS: 1000.0000 mg | ORAL_TABLET | Freq: Three times a day (TID) | ORAL | 0 refills | Status: DC | PRN

## 2017-02-22 NOTE — Discharge Instructions (Signed)
Make sure you are drinking plenty of fluid.  Avoid extremes of flexion, extension, and rotation of the neck. You may take naproxen or ibuprofen in addition to muscle relaxant.  May also use Tylenol as needed.  Follow-up with sports medicine if your symptoms persist.

## 2017-02-22 NOTE — ED Notes (Signed)
Patient transported to X-ray 

## 2017-02-22 NOTE — ED Notes (Signed)
Pt stating he hasn't had CP since early October Here today for numbness in left arm that is intermittent

## 2017-02-22 NOTE — ED Triage Notes (Signed)
Pt arrived via ems with complaint of history of left elbow to hand tingling intermittently for 2 months.  Pt also reports several episodes of chest pain.  Pt is a smoker.

## 2017-02-22 NOTE — ED Provider Notes (Signed)
[facility] Provider Note   CSN: [id number] Arrival date & time: 02/22/17  1330     History   Chief Complaint Chief Complaint  Patient presents with  . Chest Pain    left arm pain from elbow to hand intermittent 2 months    HPI Bradley Wilkins is a 41 y.o. male.  HPI Patient has had several months of intermittent left hand numbness and tingling that radiates up into the volar surface of the forearm.  No known exacerbating factors.  Currently denies any numbness.  Denies any weakness of the arm. No swelling or changes in color.  No known injuries.  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  Has occasional left-sided thoracic back and left upper chest pain.  Denies any shortness of breath.  Denies current chest pain.  Denies neck pain or recent trauma. Past Medical History:  Diagnosis Date  . Asthma     Patient Active Problem List   Diagnosis Date Noted  . Alcohol abuse 05/24/2014  . Cocaine abuse (HCC) 05/24/2014  . 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 No pertinent surgical history.     Home Medications    Prior to Admission medications   Medication Sig Start Date End Date Taking? Authorizing Provider  CREATINE PO Take 4 capsules See admin instructions by mouth. ONLY ON "WORKOUT DAYS"   Yes [provider]  multivitamin (ONE-A-DAY MEN'S) TABS tablet Take 1 tablet daily by mouth.   Yes [provider]  naphazoline-pheniramine (NAPHCON-A) 0.025-0.3 % ophthalmic solution Place 1-2 drops 4 (four) times daily as needed into both eyes for allergies.    Yes [provider]  naproxen sodium (ALEVE) 220 MG tablet Take 220-440 mg 2 (two) times daily as needed by mouth (for back pain).   Yes [provider]  methocarbamol (ROBAXIN) 500 MG tablet Take 2 tablets (1,000 mg total) every 8 (eight) hours as needed by mouth for muscle spasms.  02/22/17   Loren RacerYelverton, Sharisa Toves, MD  naproxen (NAPROSYN) 375 MG tablet Take 1 tablet (375 mg total) by mouth 2 (two) times daily. Patient not taking: Reported on 02/22/2017 09/26/15   Arthor CaptainHarris, Abigail, PA-C    Family History No family history on file.  Social History Social History   Tobacco Use  . Smoking status: Current Every Day Smoker  . Smokeless tobacco: Never Used  Substance Use Topics  . Alcohol use: Yes  . Drug use: Yes    Types: Marijuana     Allergies   Patient has no known allergies.   Review of Systems Review of Systems  Constitutional: Negative for chills and fever.  Respiratory: Negative for cough and shortness of breath.   Cardiovascular: Positive for chest pain. Negative for palpitations and leg swelling.  Gastrointestinal: Negative for abdominal pain, constipation, diarrhea, nausea and vomiting.  Musculoskeletal: Positive for back pain and myalgias. Negative for arthralgias, gait problem and joint swelling.  Skin: Negative for color change, rash and wound.  Neurological: Positive for numbness. Negative for dizziness, weakness, light-headedness and headaches.  All other systems reviewed and are negative.    Physical Exam Updated Vital Signs BP 120/74   Pulse 74   Temp 98.1 F (36.7 C) (Oral)   Resp 15   SpO2 97%   Physical Exam  Constitutional: He is oriented to person, place, and time. He appears well-developed and well-nourished.  Non-toxic appearance. He does not appear ill. No distress.  HENT:  Head: Normocephalic and atraumatic.  Mouth/Throat: Oropharynx is clear and moist.  Eyes: EOM are normal. Pupils are equal, round, and reactive to light.  Neck: Normal range of motion. Neck supple.  No midline cervical tenderness to palpation.  Full range of motion of the neck.  No obvious masses.  Cardiovascular: Normal rate, regular rhythm and intact distal pulses.  No murmur heard. Pulmonary/Chest: Effort normal and breath sounds normal. No accessory  muscle usage or stridor. No tachypnea. No respiratory distress.  Abdominal: Soft. Bowel sounds are normal. There is no tenderness. There is no rebound and no guarding.  Musculoskeletal: Normal range of motion. He exhibits no edema or tenderness.  Negative Tinel and Phalen sign.  Patient has tenderness to palpation over the left supraspinatus musculature and left pectoralis minor.  States that the palpation over the supraspinatus exacerbates the tingling sensation in his left hand.  No midline thoracic or lumbar tenderness.  Distal pulses are 2+.  Neurological: He is alert and oriented to person, place, and time.  5/5 left deltoid abduction/abduction, 5/5 elbow flexion and extension, 5/5 wrist flexion extension, 5/5 intrinsic hand muscle strength.  Sensation intact.  Ambulating without difficulty.  Skin: Skin is warm and dry. Capillary refill takes less than 2 seconds. No rash noted. No erythema.  Psychiatric: He has a normal mood and affect. His behavior is normal.  Nursing note and vitals reviewed.    ED Treatments / Results  Labs (all labs ordered are listed, but only abnormal results are displayed) Labs Reviewed  BASIC METABOLIC PANEL - Abnormal; Notable for the following components:      Result Value   Creatinine, Ser 1.25 (*)    All other components within normal limits  CBC  I-STAT TROPONIN, ED    EKG  EKG Interpretation None       Radiology Dg Chest 2 View  Result Date: 02/22/2017 CLINICAL DATA:  Chest pain and left arm tingling for 2 months. EXAM: CHEST  2 VIEW COMPARISON:  09/25/2015 chest radiograph FINDINGS: The cardiomediastinal silhouette is unremarkable. There is no evidence of focal airspace disease, pulmonary edema, suspicious pulmonary nodule/mass, pleural effusion, or pneumothorax. No acute bony abnormalities are identified. IMPRESSION: No active cardiopulmonary disease. Electronically Signed   By: Harmon PierJeffrey  Hu M.D.   On: 02/22/2017 14:10   Dg Cervical Spine  Complete  Result Date: 02/22/2017 CLINICAL DATA:  Neck pain.  Left arm numbness EXAM: CERVICAL SPINE - COMPLETE 4+ VIEW COMPARISON:  None. FINDINGS: Diffuse disc narrowing, greatest at C5-6 where there is also the greatest ridging. At C5-6 on the right uncovertebral spurring causes mild to moderate foraminal narrowing. On the left uncovertebral spurs encroach on the foramina at C3-4 C5-6, and C6-7. These narrowings are also mild to moderate. No evidence of bone lesion or fracture. Reversal of cervical lordosis. No prevertebral thickening. IMPRESSION: Disc degeneration greatest at C5-6 and C6-7. Uncovertebral spurs encroach on the foramina on the right at C5-6 and on the left at C3-4, C5-6, and C6-7. Electronically Signed   By: Marnee SpringJonathon  Watts M.D.   On: 02/22/2017 16:16    Procedures Procedures (including critical care time)  Medications Ordered in ED Medications - No data to display   Initial Impression / Assessment and Plan / ED Course  I have reviewed the triage vital signs and the nursing notes.  Pertinent labs & imaging results that were available during my care of the patient were reviewed by me and considered in my medical decision making (see chart for details).  Low suspicion for coronary artery disease.  Symptoms appear most consistent with musculoskeletal concerns and possible nerve impingement.  Differential includes radiculopathy, thoracic outlet syndrome, carpal tunnel syndrome, muscle spasm/strain.  Chest x-ray without evidence of cervical rib or Pancoast tumor.  Will check C-spine x-rays for evidence of degenerative changes.   Patient with degenerative changes and disc disease on C-spine x-rays.  Supports radicular cause for the patient's symptoms.  Will treat conservatively.  Have advised avoidance of flexion, extension and rotation of the neck.  Will refer to sports medicine.  Return precautions given. Final Clinical Impressions(s) / ED Diagnoses   Final diagnoses:    Cervical radiculopathy  Spasm of back muscles    ED Discharge Orders        Ordered    methocarbamol (ROBAXIN) 500 MG tablet  Every 8 hours PRN     02/22/17 1648       Loren Racer, MD 02/22/17 1650

## 2019-02-05 ENCOUNTER — Other Ambulatory Visit: Payer: Self-pay

## 2019-02-05 DIAGNOSIS — Z20822 Contact with and (suspected) exposure to covid-19: Secondary | ICD-10-CM

## 2019-02-06 LAB — NOVEL CORONAVIRUS, NAA: SARS-CoV-2, NAA: NOT DETECTED

## 2019-07-10 ENCOUNTER — Ambulatory Visit: Attending: Internal Medicine

## 2019-07-21 ENCOUNTER — Ambulatory Visit: Attending: Internal Medicine

## 2019-07-21 DIAGNOSIS — Z20822 Contact with and (suspected) exposure to covid-19: Secondary | ICD-10-CM

## 2019-07-23 LAB — NOVEL CORONAVIRUS, NAA: SARS-CoV-2, NAA: NOT DETECTED

## 2019-07-23 LAB — SARS-COV-2, NAA 2 DAY TAT

## 2019-07-29 ENCOUNTER — Emergency Department (HOSPITAL_COMMUNITY): Payer: Self-pay

## 2019-07-29 ENCOUNTER — Emergency Department (HOSPITAL_COMMUNITY)
Admission: EM | Admit: 2019-07-29 | Discharge: 2019-07-30 | Disposition: A | Discharge status: Home / Self Care | Payer: Self-pay | Attending: Emergency Medicine | Admitting: Emergency Medicine

## 2019-07-29 ENCOUNTER — Encounter (HOSPITAL_COMMUNITY): Payer: Self-pay | Admitting: *Deleted

## 2019-07-29 ENCOUNTER — Other Ambulatory Visit: Payer: Self-pay

## 2019-07-29 DIAGNOSIS — W3400XA Accidental discharge from unspecified firearms or gun, initial encounter: Secondary | ICD-10-CM

## 2019-07-29 DIAGNOSIS — Z79899 Other long term (current) drug therapy: Secondary | ICD-10-CM | POA: Insufficient documentation

## 2019-07-29 DIAGNOSIS — S41132A Puncture wound without foreign body of left upper arm, initial encounter: Secondary | ICD-10-CM | POA: Insufficient documentation

## 2019-07-29 DIAGNOSIS — Z23 Encounter for immunization: Secondary | ICD-10-CM | POA: Insufficient documentation

## 2019-07-29 DIAGNOSIS — S0081XA Abrasion of other part of head, initial encounter: Secondary | ICD-10-CM | POA: Insufficient documentation

## 2019-07-29 DIAGNOSIS — S01511A Laceration without foreign body of lip, initial encounter: Secondary | ICD-10-CM | POA: Insufficient documentation

## 2019-07-29 DIAGNOSIS — Y939 Activity, unspecified: Secondary | ICD-10-CM | POA: Insufficient documentation

## 2019-07-29 DIAGNOSIS — R040 Epistaxis: Secondary | ICD-10-CM | POA: Insufficient documentation

## 2019-07-29 DIAGNOSIS — Y999 Unspecified external cause status: Secondary | ICD-10-CM | POA: Insufficient documentation

## 2019-07-29 DIAGNOSIS — Y929 Unspecified place or not applicable: Secondary | ICD-10-CM | POA: Insufficient documentation

## 2019-07-29 DIAGNOSIS — F10920 Alcohol use, unspecified with intoxication, uncomplicated: Secondary | ICD-10-CM | POA: Insufficient documentation

## 2019-07-29 DIAGNOSIS — Y907 Blood alcohol level of 200-239 mg/100 ml: Secondary | ICD-10-CM | POA: Insufficient documentation

## 2019-07-29 LAB — COMPREHENSIVE METABOLIC PANEL
ALT: 37 U/L (ref 0–44)
AST: 43 U/L — ABNORMAL HIGH (ref 15–41)
Albumin: 4.4 g/dL (ref 3.5–5.0)
Alkaline Phosphatase: 71 U/L (ref 38–126)
Anion gap: 23 — ABNORMAL HIGH (ref 5–15)
BUN: 12 mg/dL (ref 6–20)
CO2: 17 mmol/L — ABNORMAL LOW (ref 22–32)
Calcium: 9.3 mg/dL (ref 8.9–10.3)
Chloride: 100 mmol/L (ref 98–111)
Creatinine, Ser: 1.15 mg/dL (ref 0.61–1.24)
GFR calc Af Amer: >60 mL/min (ref >60)
GFR calc non Af Amer: >60 mL/min (ref >60)
Glucose, Bld: 127 mg/dL — ABNORMAL HIGH (ref 70–99)
Potassium: 3.1 mmol/L — ABNORMAL LOW (ref 3.5–5.1)
Sodium: 140 mmol/L (ref 135–145)
Total Bilirubin: 0.5 mg/dL (ref 0.3–1.2)
Total Protein: 7.5 g/dL (ref 6.5–8.1)

## 2019-07-29 LAB — I-STAT CHEM 8, ED
BUN: 11 mg/dL (ref 6–20)
Calcium, Ion: 1.03 mmol/L — ABNORMAL LOW (ref 1.15–1.40)
Chloride: 103 mmol/L (ref 98–111)
Creatinine, Ser: 1.2 mg/dL (ref 0.61–1.24)
Glucose, Bld: 121 mg/dL — ABNORMAL HIGH (ref 70–99)
HCT: 47 % (ref 39.0–52.0)
Hemoglobin: 16 g/dL (ref 13.0–17.0)
Potassium: 3.1 mmol/L — ABNORMAL LOW (ref 3.5–5.1)
Sodium: 140 mmol/L (ref 135–145)
TCO2: 18 mmol/L — ABNORMAL LOW (ref 22–32)

## 2019-07-29 LAB — CBC
HCT: 45.4 % (ref 39.0–52.0)
Hemoglobin: 15.1 g/dL (ref 13.0–17.0)
MCH: 33.6 pg (ref 26.0–34.0)
MCHC: 33.3 g/dL (ref 30.0–36.0)
MCV: 101.1 fL — ABNORMAL HIGH (ref 80.0–100.0)
Platelets: 217 10*3/uL (ref 150–400)
RBC: 4.49 MIL/uL (ref 4.22–5.81)
RDW: 12.2 % (ref 11.5–15.5)
WBC: 7.3 10*3/uL (ref 4.0–10.5)
nRBC: 0 % (ref 0.0–0.2)

## 2019-07-29 LAB — ETHANOL: Alcohol, Ethyl (B): 217 mg/dL — ABNORMAL HIGH (ref <10)

## 2019-07-29 LAB — SAMPLE TO BLOOD BANK

## 2019-07-29 LAB — LACTIC ACID, PLASMA: Lactic Acid, Venous: 8.5 mmol/L (ref 0.5–1.9)

## 2019-07-29 MED ORDER — FENTANYL CITRATE (PF) 100 MCG/2ML IJ SOLN: 50.0000 ug | Freq: Once | INTRAMUSCULAR | Status: DC

## 2019-07-29 MED ORDER — LACTATED RINGERS IV BOLUS
1000.0000 mL | Freq: Once | INTRAVENOUS | Status: AC
  Administered 2019-07-29: 22:00:00 1000 mL via INTRAVENOUS

## 2019-07-29 MED ORDER — TETANUS-DIPHTH-ACELL PERTUSSIS 5-2.5-18.5 LF-MCG/0.5 IM SUSP
0.5000 mL | Freq: Once | INTRAMUSCULAR | Status: AC
  Administered 2019-07-29: 0.5 mL via INTRAMUSCULAR

## 2019-07-29 MED ORDER — FENTANYL CITRATE (PF) 100 MCG/2ML IJ SOLN
INTRAMUSCULAR | Status: AC
  Filled 2019-07-29: qty 2

## 2019-07-29 MED ORDER — ONDANSETRON HCL 4 MG/2ML IJ SOLN: 4.0000 mg | Freq: Once | INTRAMUSCULAR | Status: AC

## 2019-07-29 MED ORDER — CEFAZOLIN SODIUM-DEXTROSE 2-4 GM/100ML-% IV SOLN
2.0000 g | Freq: Once | INTRAVENOUS | Status: AC
  Administered 2019-07-29: 2 g via INTRAVENOUS

## 2019-07-29 MED ORDER — FENTANYL CITRATE (PF) 100 MCG/2ML IJ SOLN
INTRAMUSCULAR | Status: AC | PRN
  Administered 2019-07-29: 50 ug via INTRAVENOUS

## 2019-07-29 MED ORDER — ONDANSETRON HCL 4 MG/2ML IJ SOLN
INTRAMUSCULAR | Status: AC
  Filled 2019-07-29: qty 2

## 2019-07-29 NOTE — ED Notes (Signed)
Pt more comfortable after medications, more complaint w/ requests.

## 2019-07-29 NOTE — ED Notes (Signed)
Pt and RN to CT1.  Pt was able to move self to scanner.
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
## 2019-07-29 NOTE — ED Notes (Signed)
Urinal at bedside.  
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
## 2019-07-30 ENCOUNTER — Encounter (HOSPITAL_COMMUNITY): Payer: Self-pay | Admitting: *Deleted

## 2019-07-30 LAB — LACTIC ACID, PLASMA
Lactic Acid, Venous: 6.2 mmol/L (ref 0.5–1.9)
Lactic Acid, Venous: 4.8 mmol/L (ref 0.5–1.9)

## 2019-07-30 MED ORDER — LACTATED RINGERS IV BOLUS
1000.0000 mL | Freq: Once | INTRAVENOUS | Status: AC
  Administered 2019-07-30: 1000 mL via INTRAVENOUS

## 2019-07-30 NOTE — ED Provider Notes (Signed)
I assumed care in signout to reassess patient.  His lactate is trending down.  His heart rate is improved BP (!) 131/94   Pulse 98   Temp (!) 97 F (36.1 C) (Oral)   Resp 18   Ht 1.702 m (5\' 7" )   Wt 63.5 kg   SpO2 100%   BMI 21.93 kg/m  Patient likely dehydrated and also admits to substance abuse. Wound examined, no active bleeding.  Distal pulses intact.  Appropriate grip strength noted left hand.  Reports numbness in 3 of his fingers but no other neurologic symptoms reported Patient is requesting discharge home   , MD 07/30/19 [phone]

## 2019-07-30 NOTE — ED Notes (Signed)
Pt sleeping, remains tachycardic, rate 120s. Pt denies complaints at present. Able to tolerate water to drink

## 2019-08-16 ENCOUNTER — Other Ambulatory Visit: Payer: Self-pay

## 2019-08-16 ENCOUNTER — Encounter (HOSPITAL_COMMUNITY): Payer: Self-pay | Admitting: Emergency Medicine

## 2019-08-16 ENCOUNTER — Emergency Department (HOSPITAL_COMMUNITY)
Admission: EM | Admit: 2019-08-16 | Discharge: 2019-08-16 | Disposition: A | Discharge status: Home / Self Care | Payer: Self-pay | Attending: Emergency Medicine | Admitting: Emergency Medicine

## 2019-08-16 ENCOUNTER — Emergency Department (HOSPITAL_COMMUNITY): Payer: Self-pay

## 2019-08-16 DIAGNOSIS — M25522 Pain in left elbow: Secondary | ICD-10-CM | POA: Insufficient documentation

## 2019-08-16 DIAGNOSIS — F17200 Nicotine dependence, unspecified, uncomplicated: Secondary | ICD-10-CM | POA: Insufficient documentation

## 2019-08-16 DIAGNOSIS — S41132D Puncture wound without foreign body of left upper arm, subsequent encounter: Secondary | ICD-10-CM | POA: Insufficient documentation

## 2019-08-16 DIAGNOSIS — R200 Anesthesia of skin: Secondary | ICD-10-CM | POA: Insufficient documentation

## 2019-08-16 DIAGNOSIS — J45909 Unspecified asthma, uncomplicated: Secondary | ICD-10-CM | POA: Insufficient documentation

## 2019-08-16 DIAGNOSIS — W3400XD Accidental discharge from unspecified firearms or gun, subsequent encounter: Secondary | ICD-10-CM | POA: Insufficient documentation

## 2019-08-16 DIAGNOSIS — Z79899 Other long term (current) drug therapy: Secondary | ICD-10-CM | POA: Insufficient documentation

## 2019-08-16 MED ORDER — FENTANYL CITRATE (PF) 100 MCG/2ML IJ SOLN
75.0000 ug | Freq: Once | INTRAMUSCULAR | Status: AC
  Administered 2019-08-16: 06:00:00 75 ug via INTRAVENOUS
  Filled 2019-08-16: qty 2

## 2019-08-16 MED ORDER — METHOCARBAMOL 500 MG PO TABS
500.0000 mg | ORAL_TABLET | Freq: Once | ORAL | Status: AC
  Administered 2019-08-16: 06:00:00 500 mg via ORAL
  Filled 2019-08-16: qty 1

## 2019-08-16 MED ORDER — OXYCODONE-ACETAMINOPHEN 5-325 MG PO TABS
1.0000 | ORAL_TABLET | ORAL | Status: DC | PRN
  Administered 2019-08-16: 1 via ORAL
  Filled 2019-08-16: qty 1

## 2019-08-16 MED ORDER — METHOCARBAMOL 500 MG PO TABS: 500.0000 mg | ORAL_TABLET | Freq: Two times a day (BID) | ORAL | 0 refills | Status: DC
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
## 2019-08-16 NOTE — Discharge Instructions (Addendum)
Thank you for allowing me to care for you today in the Emergency Department.   Your x-rays today were reassuring.  No broken bones or concern for infection or bleeding in the arm.  Call the number on your discharge paperwork to get established with a primary care provider.  If your pain does not significantly improve with the regimen below over the next few weeks.  You can follow-up with orthopedics in the clinic.  Their information is listed above.  Take 650 mg of Tylenol or 600 mg of ibuprofen with food every 6 hours for pain.  You can alternate between these 2 medications every 3 hours if your pain returns.  For instance, you can take Tylenol at noon, followed by a dose of ibuprofen at 3, followed by second dose of Tylenol and 6.  You can take 1 tablet of Robaxin up to 2 times daily to help with muscle pain and spasms.  Apply ice packs for 15 to 20 minutes up to 3-4 times daily.  Try to elevate your left arm at about the level of your heart to with pain and swelling.  Return to the emergency department if you have a new fall or injury, if the arm gets very swollen, red to the touch, or if you start to have thick, mucus-like drainage from the wounds, if you start having numbness in other digits of your hand, or if you develop other new, concerning symptoms.
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
## 2020-07-09 ENCOUNTER — Encounter (HOSPITAL_COMMUNITY): Payer: Self-pay

## 2020-07-09 ENCOUNTER — Other Ambulatory Visit: Payer: Self-pay

## 2020-07-09 ENCOUNTER — Emergency Department (HOSPITAL_COMMUNITY)
Admission: EM | Admit: 2020-07-09 | Discharge: 2020-07-09 | Disposition: A | Discharge status: Home / Self Care | Payer: Self-pay | Attending: Emergency Medicine | Admitting: Emergency Medicine

## 2020-07-09 DIAGNOSIS — F17200 Nicotine dependence, unspecified, uncomplicated: Secondary | ICD-10-CM | POA: Insufficient documentation

## 2020-07-09 DIAGNOSIS — J45909 Unspecified asthma, uncomplicated: Secondary | ICD-10-CM | POA: Insufficient documentation

## 2020-07-09 DIAGNOSIS — M545 Low back pain, unspecified: Secondary | ICD-10-CM | POA: Insufficient documentation

## 2020-07-09 DIAGNOSIS — Y92410 Unspecified street and highway as the place of occurrence of the external cause: Secondary | ICD-10-CM | POA: Insufficient documentation

## 2020-07-09 DIAGNOSIS — M25552 Pain in left hip: Secondary | ICD-10-CM | POA: Insufficient documentation

## 2020-07-09 MED ORDER — METHOCARBAMOL 500 MG PO TABS: 500.0000 mg | ORAL_TABLET | Freq: Two times a day (BID) | ORAL | 0 refills | Status: DC

## 2020-07-09 MED ORDER — LIDOCAINE 5 % EX PTCH
1.0000 | MEDICATED_PATCH | CUTANEOUS | Status: DC
  Administered 2020-07-09: 1 via TRANSDERMAL
  Filled 2020-07-09: qty 1

## 2020-07-09 NOTE — ED Notes (Signed)
ED Provider at bedside. 
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
## 2021-09-29 ENCOUNTER — Ambulatory Visit: Payer: Self-pay | Admitting: Emergency Medicine
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
## 2022-11-26 ENCOUNTER — Encounter (HOSPITAL_BASED_OUTPATIENT_CLINIC_OR_DEPARTMENT_OTHER): Payer: Self-pay

## 2022-11-26 ENCOUNTER — Emergency Department (HOSPITAL_BASED_OUTPATIENT_CLINIC_OR_DEPARTMENT_OTHER)
Admission: EM | Admit: 2022-11-26 | Discharge: 2022-11-26 | Disposition: A | Discharge status: Home / Self Care | Payer: Self-pay | Attending: Emergency Medicine | Admitting: Emergency Medicine

## 2022-11-26 ENCOUNTER — Emergency Department (HOSPITAL_BASED_OUTPATIENT_CLINIC_OR_DEPARTMENT_OTHER): Payer: Self-pay

## 2022-11-26 ENCOUNTER — Other Ambulatory Visit: Payer: Self-pay

## 2022-11-26 DIAGNOSIS — M25561 Pain in right knee: Secondary | ICD-10-CM
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
## 2022-11-26 NOTE — Discharge Instructions (Signed)
Overall I do think you have inflammation of your patellar tendon/knee.  Recommend 600 mg ibuprofen every 8 hours as needed.  Recommend 1000 mg of Tylenol every 6 hours as needed.  Recommend ice and compression.  Wear kneepads at work.  Try to avoid hitting her knee repetitively.  Follow-up with orthopedics.  Return if symptoms worsen.

## 2022-11-26 NOTE — ED Notes (Signed)
Ace wrap applied

## 2022-12-03 ENCOUNTER — Encounter (HOSPITAL_BASED_OUTPATIENT_CLINIC_OR_DEPARTMENT_OTHER): Payer: Self-pay

## 2022-12-03 ENCOUNTER — Other Ambulatory Visit: Payer: Self-pay

## 2022-12-03 ENCOUNTER — Emergency Department (HOSPITAL_BASED_OUTPATIENT_CLINIC_OR_DEPARTMENT_OTHER)
Admission: EM | Admit: 2022-12-03 | Discharge: 2022-12-03 | Disposition: A | Discharge status: Home / Self Care | Payer: Self-pay | Attending: Emergency Medicine | Admitting: Emergency Medicine

## 2022-12-03 DIAGNOSIS — L03115 Cellulitis of right lower limb: Secondary | ICD-10-CM | POA: Insufficient documentation

## 2022-12-03 DIAGNOSIS — M25561 Pain in right knee: Secondary | ICD-10-CM

## 2022-12-03 MED ORDER — ACETAMINOPHEN 500 MG PO TABS
1000.0000 mg | ORAL_TABLET | Freq: Once | ORAL | Status: AC
  Administered 2022-12-03: 1000 mg via ORAL
  Filled 2022-12-03: qty 2

## 2022-12-03 MED ORDER — IBUPROFEN 800 MG PO TABS: 800.0000 mg | ORAL_TABLET | Freq: Three times a day (TID) | ORAL | 0 refills | Status: AC

## 2022-12-03 MED ORDER — LIDOCAINE-EPINEPHRINE (PF) 2 %-1:200000 IJ SOLN
10.0000 mL | Freq: Once | INTRAMUSCULAR | Status: AC
  Administered 2022-12-03: 10 mL
  Filled 2022-12-03: qty 20

## 2022-12-03 MED ORDER — CEPHALEXIN 500 MG PO CAPS: 500.0000 mg | ORAL_CAPSULE | Freq: Four times a day (QID) | ORAL | 0 refills | Status: DC

## 2022-12-03 NOTE — ED Notes (Signed)
ED Provider at bedside. 

## 2022-12-03 NOTE — Discharge Instructions (Addendum)
You were seen in the emergency department for knee pain with possible infection/abscess.  Try to keep the area as clean and dry as possible. It is okay to let warm soapy water run over the area, but do NOT scrub the area.   I am placing you on a course of antibiotics. You can take ibuprofen or tylenol as needed for pain. You can continue using ice as needed for swelling.  Continue to monitor how you're doing and return to the ER for new or worsening symptoms.   I'd like you to follow up with the orthopedist if your pain does not improve. I've attached their contact information for you to call and make an appointment.
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
## 2023-03-02 ENCOUNTER — Ambulatory Visit: Payer: Self-pay | Admitting: Internal Medicine

## 2023-08-02 ENCOUNTER — Ambulatory Visit: Admitting: Family Medicine

## 2023-08-21 ENCOUNTER — Encounter: Payer: Self-pay | Admitting: Internal Medicine

## 2023-08-21 ENCOUNTER — Ambulatory Visit: Admitting: Internal Medicine

## 2023-08-21 VITALS — BP 110/62 | HR 79 | Temp 98.0°F | Ht 67.0 in | Wt 144.2 lb

## 2023-08-21 DIAGNOSIS — Z0001 Encounter for general adult medical examination with abnormal findings: Secondary | ICD-10-CM

## 2023-08-21 DIAGNOSIS — M79642 Pain in left hand: Secondary | ICD-10-CM | POA: Diagnosis not present

## 2023-08-21 DIAGNOSIS — Z119 Encounter for screening for infectious and parasitic diseases, unspecified: Secondary | ICD-10-CM | POA: Diagnosis not present

## 2023-08-21 DIAGNOSIS — M5432 Sciatica, left side: Secondary | ICD-10-CM | POA: Diagnosis not present

## 2023-08-21 DIAGNOSIS — Z9189 Other specified personal risk factors, not elsewhere classified: Secondary | ICD-10-CM | POA: Diagnosis not present

## 2023-08-21 DIAGNOSIS — Z125 Encounter for screening for malignant neoplasm of prostate: Secondary | ICD-10-CM

## 2023-08-21 DIAGNOSIS — Z13220 Encounter for screening for lipoid disorders: Secondary | ICD-10-CM

## 2023-08-21 DIAGNOSIS — Z0000 Encounter for general adult medical examination without abnormal findings: Secondary | ICD-10-CM

## 2023-08-21 DIAGNOSIS — Z87828 Personal history of other (healed) physical injury and trauma: Secondary | ICD-10-CM | POA: Insufficient documentation

## 2023-08-21 DIAGNOSIS — Z1211 Encounter for screening for malignant neoplasm of colon: Secondary | ICD-10-CM

## 2023-08-21 DIAGNOSIS — J3489 Other specified disorders of nose and nasal sinuses: Secondary | ICD-10-CM | POA: Diagnosis not present

## 2023-08-21 LAB — COMPREHENSIVE METABOLIC PANEL WITH GFR
ALT: 18 U/L (ref 0–53)
AST: 20 U/L (ref 0–37)
Albumin: 4.9 g/dL (ref 3.5–5.2)
Alkaline Phosphatase: 59 U/L (ref 39–117)
BUN: 10 mg/dL (ref 6–23)
CO2: 30 meq/L (ref 19–32)
Calcium: 10.4 mg/dL (ref 8.4–10.5)
Chloride: 102 meq/L (ref 96–112)
Creatinine, Ser: 1.05 mg/dL (ref 0.40–1.50)
GFR: 84.27 mL/min (ref >60.00)
Glucose, Bld: 80 mg/dL (ref 70–99)
Potassium: 4.5 meq/L (ref 3.5–5.1)
Sodium: 140 meq/L (ref 135–145)
Total Bilirubin: 0.6 mg/dL (ref 0.2–1.2)
Total Protein: 7.2 g/dL (ref 6.0–8.3)

## 2023-08-21 LAB — CBC WITH DIFFERENTIAL/PLATELET
Basophils Absolute: 0 10*3/uL (ref 0.0–0.1)
Basophils Relative: 0.2 % (ref 0.0–3.0)
Eosinophils Absolute: 0.2 10*3/uL (ref 0.0–0.7)
Eosinophils Relative: 4.7 % (ref 0.0–5.0)
HCT: 40.7 % (ref 39.0–52.0)
Hemoglobin: 13.8 g/dL (ref 13.0–17.0)
Lymphocytes Relative: 58.7 % — ABNORMAL HIGH (ref 12.0–46.0)
Lymphs Abs: 2.3 10*3/uL (ref 0.7–4.0)
MCHC: 33.9 g/dL (ref 30.0–36.0)
MCV: 98 fl (ref 78.0–100.0)
Monocytes Absolute: 0.5 10*3/uL (ref 0.1–1.0)
Monocytes Relative: 12.5 % — ABNORMAL HIGH (ref 3.0–12.0)
Neutro Abs: 0.9 10*3/uL — ABNORMAL LOW (ref 1.4–7.7)
Neutrophils Relative %: 23.9 % — ABNORMAL LOW (ref 43.0–77.0)
Platelets: 181 10*3/uL (ref 150.0–400.0)
RBC: 4.15 Mil/uL — ABNORMAL LOW (ref 4.22–5.81)
RDW: 12.7 % (ref 11.5–15.5)
WBC: 3.8 10*3/uL — ABNORMAL LOW (ref 4.0–10.5)

## 2023-08-21 LAB — LIPID PANEL
Cholesterol: 220 mg/dL — ABNORMAL HIGH (ref 0–200)
HDL: 75.8 mg/dL (ref >39.00)
LDL Cholesterol: 122 mg/dL — ABNORMAL HIGH (ref 0–99)
NonHDL: 144.05
Total CHOL/HDL Ratio: 3
Triglycerides: 109 mg/dL (ref 0.0–149.0)
VLDL: 21.8 mg/dL (ref 0.0–40.0)

## 2023-08-21 LAB — PSA: PSA: 0.61 ng/mL (ref 0.10–4.00)

## 2023-08-21 MED ORDER — GABAPENTIN 300 MG PO CAPS
300.0000 mg | ORAL_CAPSULE | Freq: Three times a day (TID) | ORAL | 3 refills | Status: DC
Start: 2023-08-21 — End: 2023-08-28
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
## 2023-08-21 NOTE — Assessment & Plan Note (Signed)
 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 He is hesitant about surgical intervention, but an ENT referral is planned for further evaluation.
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
## 2023-08-22 LAB — HIV ANTIBODY (ROUTINE TESTING W REFLEX): HIV 1&2 Ab, 4th Generation: NONREACTIVE

## 2023-08-23 ENCOUNTER — Ambulatory Visit: Payer: Self-pay | Admitting: Internal Medicine

## 2023-08-23 DIAGNOSIS — D72810 Lymphocytopenia: Secondary | ICD-10-CM

## 2023-08-23 DIAGNOSIS — D72819 Decreased white blood cell count, unspecified: Secondary | ICD-10-CM | POA: Insufficient documentation

## 2023-08-23 DIAGNOSIS — E785 Hyperlipidemia, unspecified: Secondary | ICD-10-CM | POA: Insufficient documentation

## 2023-08-23 DIAGNOSIS — E782 Mixed hyperlipidemia: Secondary | ICD-10-CM

## 2023-08-23 LAB — HCV RNA QUANT RFLX ULTRA OR GENOTYP: HCV Quant Baseline: NOT DETECTED [IU]/mL

## 2023-08-23 LAB — TSH RFX ON ABNORMAL TO FREE T4: TSH: 1.21 u[IU]/mL (ref 0.450–4.500)

## 2023-08-23 NOTE — Progress Notes (Signed)
 Reviewed labs from 08/21/2023. Key findings: Low white blood cell count (3.8 K/uL) with low neutrophils (0.9 K/uL) and elevated lymphocytes (58.7%). Also noted elevated total cholesterol (220 mg/dL) and LDL cholesterol (284 mg/dL). Clinical interpretation: These findings suggest mild leukopenia with neutropenia that requires monitoring. Your cholesterol levels are above recommended targets and warrant lifestyle modifications. Next steps: Please schedule follow-up in 4-6 weeks for repeat blood work. Focus on heart-healthy diet and regular exercise for cholesterol management. See Patient Message for details.

## 2023-08-28 ENCOUNTER — Other Ambulatory Visit: Payer: Self-pay

## 2023-08-28 ENCOUNTER — Ambulatory Visit: Payer: Self-pay

## 2023-08-28 DIAGNOSIS — M5432 Sciatica, left side: Secondary | ICD-10-CM

## 2023-08-28 MED ORDER — GABAPENTIN 300 MG PO CAPS
300.0000 mg | ORAL_CAPSULE | Freq: Three times a day (TID) | ORAL | 3 refills | Status: DC
Start: 2023-08-28 — End: 2024-02-14

## 2023-08-28 NOTE — Telephone Encounter (Signed)
 Spoke with pt wife understood labs well. Pt will make appt for 4 weeks out with provider to discuss concerns about labs and  new lab work same day. Pt is still very tired at times. Pt is requesting Chantix stated was discussed at his visit. Notified Provider.

## 2023-08-28 NOTE — Telephone Encounter (Signed)
  Copied from CRM [id number]. Topic: Clinical - Red Word Triage >> Aug 28, 2023  1:16 PM Shardie S wrote: [facility] that prompted transfer to Nurse Triage: extreme fatigue Answer Assessment - Initial Assessment Questions 1. REASON FOR CALL or QUESTION: "What is your reason for calling today?" or "How can I best help you?" or "What question do you have that I can help answer?"      --- Patient spouse calling on behalf of patient regarding recent s/s of fatigue. Patient not available during the call and spouse noted that a DPR was filed ( none noted after chart review).   Spouse concerned regarding recent lab results and had specific questions (No DPR noted for spouse- no information was released).   Spouse inquiring if we received a message regarding a prescription.  Loss connection prior to gathering additional information.  Attempted to call back, poor connection noted.  Protocols used: Information Only Call - No Triage-A-AH

## 2023-08-28 NOTE — Telephone Encounter (Signed)
 Copied from CRM [id number]. Topic: Clinical - Medication Refill >> Aug 28, 2023  7:54 AM Emmet Harm C wrote: Medication:  gabapentin  (NEURONTIN ) 300 MG capsule and Chantix   Has the patient contacted their pharmacy? No (Agent: If no, request that the patient contact the pharmacy for the refill. If patient does not wish to contact the pharmacy document the reason why and proceed with request.) (Agent: If yes, when and what did the pharmacy advise?)  This is the patient's preferred pharmacy:   [facility] #[id number] Jonette Nestle, [place] - [address] AT [facility] OF [facility] & [address] [place] [address] Phone: [phone] Fax: [phone]  Is this the correct pharmacy for this prescription? Yes If no, delete pharmacy and type the correct one.   Has the prescription been filled recently? No  Is the patient out of the medication? Yes  Has the patient been seen for an appointment in the last year OR does the patient have an upcoming appointment? Yes  Can we respond through MyChart? No  Agent: Please be advised that Rx refills may take up to 3 business days. We ask that you follow-up with your pharmacy.

## 2023-08-29 ENCOUNTER — Other Ambulatory Visit: Payer: Self-pay

## 2023-08-29 ENCOUNTER — Other Ambulatory Visit: Payer: Self-pay | Admitting: Internal Medicine

## 2023-08-29 DIAGNOSIS — F17200 Nicotine dependence, unspecified, uncomplicated: Secondary | ICD-10-CM | POA: Insufficient documentation

## 2023-08-29 MED ORDER — VARENICLINE TARTRATE (STARTER) 0.5 MG X 11 & 1 MG X 42 PO TBPK
ORAL_TABLET | ORAL | 0 refills | Status: AC
Start: 2023-08-29 — End: ?

## 2023-09-05 ENCOUNTER — Telehealth: Payer: Self-pay

## 2023-09-05 NOTE — Telephone Encounter (Signed)
 Copied from CRM [id number]. Topic: Clinical - Prescription Issue >> Sep 04, 2023  3:32 PM Magdalene School wrote: Reason for CRM: Patient's wife called stating that when she went to [facility] to pick up gabapentin  (NEURONTIN ) 300 MG capsule and Varenicline Tartrate, Starter, (CHANTIX STARTING MONTH PAK) 0.5 MG X 11 & 1 MG X 42 TBPK, insurance stated that it is too early for prescription refill even though patient never picked up prescription in the first place.  [facility] DR[facility]ORE #[id number] Jonette Nestle, [place] - [address] AT [place] OF [facility] & [address] [place] [address] Phone: [phone] Fax: [phone]  Spoke with pharmacy [facility] about medication listed above stated medication has been ready no problems on picking meds up. I called pt spoke with wife she is going to pick up medication today.

## 2023-09-07 DIAGNOSIS — Z1211 Encounter for screening for malignant neoplasm of colon: Secondary | ICD-10-CM | POA: Diagnosis not present

## 2023-09-11 LAB — COLOGUARD: COLOGUARD: NEGATIVE

## 2023-09-24 ENCOUNTER — Ambulatory Visit: Admitting: Internal Medicine

## 2023-10-10 ENCOUNTER — Ambulatory Visit: Admitting: Internal Medicine

## 2023-11-27 ENCOUNTER — Emergency Department (HOSPITAL_BASED_OUTPATIENT_CLINIC_OR_DEPARTMENT_OTHER)

## 2023-11-27 ENCOUNTER — Emergency Department (HOSPITAL_BASED_OUTPATIENT_CLINIC_OR_DEPARTMENT_OTHER)
Admission: EM | Admit: 2023-11-27 | Discharge: 2023-11-28 | Disposition: A | Discharge status: Home / Self Care | Attending: Emergency Medicine | Admitting: Emergency Medicine

## 2023-11-27 ENCOUNTER — Encounter (HOSPITAL_BASED_OUTPATIENT_CLINIC_OR_DEPARTMENT_OTHER): Payer: Self-pay

## 2023-11-27 ENCOUNTER — Other Ambulatory Visit: Payer: Self-pay

## 2023-11-27 DIAGNOSIS — M25552 Pain in left hip: Secondary | ICD-10-CM | POA: Diagnosis not present

## 2023-11-27 DIAGNOSIS — M47816 Spondylosis without myelopathy or radiculopathy, lumbar region: Secondary | ICD-10-CM | POA: Diagnosis not present

## 2023-11-27 DIAGNOSIS — M5442 Lumbago with sciatica, left side: Secondary | ICD-10-CM | POA: Insufficient documentation

## 2023-11-27 DIAGNOSIS — M5432 Sciatica, left side: Secondary | ICD-10-CM

## 2023-11-27 DIAGNOSIS — Z041 Encounter for examination and observation following transport accident: Secondary | ICD-10-CM | POA: Diagnosis not present

## 2023-11-27 DIAGNOSIS — M25562 Pain in left knee: Secondary | ICD-10-CM | POA: Diagnosis not present

## 2023-11-27 DIAGNOSIS — M545 Low back pain, unspecified: Secondary | ICD-10-CM | POA: Diagnosis not present

## 2023-11-27 DIAGNOSIS — Y92410 Unspecified street and highway as the place of occurrence of the external cause: Secondary | ICD-10-CM | POA: Diagnosis not present

## 2023-11-27 MED ORDER — DEXAMETHASONE SODIUM PHOSPHATE 10 MG/ML IJ SOLN
10.0000 mg | Freq: Once | INTRAMUSCULAR | Status: AC
  Administered 2023-11-28: 10 mg via INTRAMUSCULAR
  Filled 2023-11-27: qty 1

## 2023-11-27 MED ORDER — KETOROLAC TROMETHAMINE 60 MG/2ML IM SOLN
60.0000 mg | Freq: Once | INTRAMUSCULAR | Status: AC
  Administered 2023-11-28: 60 mg via INTRAMUSCULAR
  Filled 2023-11-27: qty 2

## 2023-11-27 NOTE — ED Notes (Signed)
 ED Provider at bedside.
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
## 2023-11-28 MED ORDER — METHYLPREDNISOLONE 4 MG PO TBPK: ORAL_TABLET | ORAL | 0 refills | Status: AC

## 2023-11-28 MED ORDER — METHOCARBAMOL 500 MG PO TABS: 500.0000 mg | ORAL_TABLET | Freq: Three times a day (TID) | ORAL | 0 refills | Status: AC | PRN

## 2023-12-04 ENCOUNTER — Institutional Professional Consult (permissible substitution) (INDEPENDENT_AMBULATORY_CARE_PROVIDER_SITE_OTHER): Admitting: Otolaryngology

## 2023-12-04 NOTE — Progress Notes (Deleted)
 Dear Dr. Jesus, Here is my assessment for our mutual patient, Bradley Wilkins. Thank you for allowing me the opportunity to care for your patient. Please do not hesitate to contact me should you have any other questions. Sincerely, Dr. Eldora Blanch  Otolaryngology Clinic Note  HISTORY: Bradley Wilkins is a 48 y.o. male with history of *** kindly referred by Dr. Jesus for evaluation of ***.   He reports ***.  Current symptoms include ***.  He denies ***.  Symptoms began *** ago.  Symptom severity is ***.  Improvement occurred with ***.  Additional evaluation has included ***.  Allergy testing *** been done.  *** No previous sinonasal surgery.  He is currently using ***.  GLP-1: *** AP/AC: ***  Tobacco: yes, current smoker Lives in ***  PMHx: DDD, Nasal Septal Perforation  RADIOGRAPHIC EVALUATION AND INDEPENDENT REVIEW OF OTHER RECORDS:: Dr. Jesus (08/21/2023): noted nasal septal perforation, prior drug use(?); Dx: NSP and nasal congestion; Rx: ref to ENT CBC and CMP 08/21/2023: WBC 3.8, Eos 200; BUN/Cr 10/1.05 [facility] 07/29/2019 independently interpreted with respect to sinuses: paranasal sinuses clear except for mild MPT bilateral dependent max, anterior septal perforation noted Past Medical History:  Diagnosis Date   Alcohol abuse 05/24/2014   Asthma    Cocaine abuse (HCC) 05/24/2014   No past surgical history on file. Family History  Problem Relation Age of Onset   HIV Mother    Liver disease Mother    Drug abuse Father    Social History   Tobacco Use   Smoking status: Every Day    Types: Cigars   Smokeless tobacco: Never  Substance Use Topics   Alcohol use: Not Currently    Comment: quit drinking 2.5 years   No Known Allergies Current Outpatient Medications  Medication Sig Dispense Refill   BLACK CURRANT SEED OIL PO Take by mouth.     gabapentin  (NEURONTIN ) 300 MG capsule Take 1 capsule (300 mg total) by mouth 3 (three) times daily. 90 capsule 3    ibuprofen  (ADVIL ) 800 MG tablet Take 1 tablet (800 mg total) by mouth 3 (three) times daily. 21 tablet 0   methocarbamol  (ROBAXIN ) 500 MG tablet Take 1 tablet (500 mg total) by mouth every 8 (eight) hours as needed for muscle spasms. 20 tablet 0   methylPREDNISolone  (MEDROL  DOSEPAK) 4 MG TBPK tablet As directed 21 tablet 0   Multiple Vitamin (MULTIVITAMIN WITH MINERALS) TABS tablet Take 1 tablet by mouth daily.     multivitamin (ONE-A-DAY MEN'S) TABS tablet Take 1 tablet daily by mouth.     Varenicline  Tartrate, Starter, (CHANTIX  STARTING MONTH PAK) 0.5 MG X 11 & 1 MG X 42 TBPK Take one 0.5 mg tablet by mouth once daily for 3 days, then increase to one 0.5 mg tablet twice daily for 4 days, then increase to one 1 mg tablet twice daily. 53 each 0   VITAMIN E PO Take 1 tablet by mouth daily.     No current facility-administered medications for this visit.   There were no vitals taken for this visit.  PHYSICAL EXAM:  There were no vitals taken for this visit.   Salient findings:  CN II-XII intact *** Bilateral EAC clear and TM intact with well pneumatized middle ear spaces Weber 512: Rinne 512: AC > BC b/l *** Rine 1024: AC > BC b/l *** Nose: Anterior rhinoscopy reveals ***.  Nasal endoscopy was indicated to better evaluate the nose and paranasal sinuses, given the patient's history and exam findings,  and is detailed below. No lesions of oral cavity/oropharynx; dentition *** No obviously palpable neck masses/lymphadenopathy/thyromegaly No respiratory distress or stridor***   PROCEDURE:  Prior to initiating any procedures, risks/benefits/alternatives were explained to the patient and verbal consent obtained. Diagnostic Nasal Endoscopy Pre-procedure diagnosis: Concern for *** Post-procedure diagnosis: same Indication: See pre-procedure diagnosis and physical exam above Complications: None apparent EBL: *** mL Anesthesia: Lidocaine  4% and topical decongestant was topically sprayed in each  nasal cavity  Description of Procedure:  Patient was identified. A rigid 30 degree endoscope was utilized to evaluate the sinonasal cavities, mucosa, sinus ostia and turbinates and septum.  Overall, signs of mucosal inflammation are*** noted.  Also noted are ***.  No mucopurulence, polyps, or masses noted.   Right Middle meatus: *** Right SE Recess: *** Left MM: *** Left SE Recess: *** Photodocumentation was obtained.  CPT CODE -- 31231 - Mod 25   ASSESSMENT:  ***  No diagnosis found.   PLAN: We've discussed issues and options today.  We reviewed the nasal endoscopy images together.  The risks, benefits and alternatives were discussed and questions answered.  He has elected to proceed with:  1) 2) Follow-up in *** -- sooner as necessary.  See below regarding exact medications prescribed this encounter including dosages and route: No orders of the defined types were placed in this encounter.    Thank you for allowing me the opportunity to care for your patient. Please do not hesitate to contact me should you have any other questions.  Sincerely, Eldora Blanch, MD Otolaryngologist (ENT), [facility] Phone: [phone] Fax: [phone]  MDM:  Level ***: 99*** Complexity/Problems addressed: *** Data complexity: *** independent interpretation of *** - Morbidity: ***  - Prescription Drug prescribed or managed: ***  12/04/2023, 2:51 PM

## 2024-02-12 ENCOUNTER — Other Ambulatory Visit: Payer: Self-pay | Admitting: Internal Medicine

## 2024-02-12 DIAGNOSIS — M5432 Sciatica, left side: Secondary | ICD-10-CM

## 2024-03-13 ENCOUNTER — Other Ambulatory Visit: Payer: Self-pay | Admitting: Internal Medicine

## 2024-03-13 DIAGNOSIS — M5432 Sciatica, left side: Secondary | ICD-10-CM

## 2024-03-13 MED ORDER — GABAPENTIN 300 MG PO CAPS: 300.0000 mg | ORAL_CAPSULE | Freq: Three times a day (TID) | ORAL | 3 refills | Status: AC

## 2024-03-13 NOTE — Telephone Encounter (Signed)
 Copied from CRM #[id number]. Topic: Clinical - Medication Refill >> Mar 13, 2024 11:44 AM Viola F wrote: Medication: gabapentin  (NEURONTIN ) 300 MG capsule [[id number]]  Has the patient contacted their pharmacy? Yes (Agent: If no, request that the patient contact the pharmacy for the refill. If patient does not wish to contact the pharmacy document the reason why and proceed with request.) (Agent: If yes, when and what did the pharmacy advise?)  This is the patient's preferred pharmacy:   [facility]ORE #[id number] [place], [place] - [address] AT [facility] OF [facility] & [address] [place] [address] Phone: [phone] Fax: [phone]  Is this the correct pharmacy for this prescription? Yes If no, delete pharmacy and type the correct one.   Has the prescription been filled recently? Yes  Is the patient out of the medication? Yes  Has the patient been seen for an appointment in the last year OR does the patient have an upcoming appointment? Yes  Can we respond through MyChart? No  Agent: Please be advised that Rx refills may take up to 3 business days. We ask that you follow-up with your pharmacy.

## 2024-03-14 ENCOUNTER — Ambulatory Visit: Payer: Self-pay

## 2024-03-14 NOTE — Telephone Encounter (Signed)
  FYI Only or Action Required?: FYI only for provider: appointment scheduled on 03/27/2024.  Patient was last seen in primary care on 08/21/2023 by Jesus Bernardino MATSU, MD.  Called Nurse Triage reporting Back Pain.  Symptoms began several months ago.  Interventions attempted: OTC medications: tylenol , gabepentin.  Symptoms are: unchanged.  Triage Disposition: See PCP When Office is Open (Within 3 Days)  Patient/caregiver understands and will follow disposition?: Yes Copied from CRM [id number]. Topic: Clinical - Red Word Triage >> Mar 14, 2024  4:31 PM Alexandria E wrote: [facility] that prompted transfer to Nurse Triage: Wife, Lorette, called in stating that patient is having back pain and frequent urination. Symptoms going on for one week. Reason for Disposition  [1] Pain radiates into the thigh or further down the leg AND [2] one leg  Answer Assessment - Initial Assessment Questions 1. ONSET: When did the pain begin? (e.g., minutes, hours, days)     A week ago 2. LOCATION: Where does it hurt? (upper, mid or lower back)     Low back pain 3. SEVERITY: How bad is the pain?  (e.g., Scale 1-10; mild, moderate, or severe)     7.5/10 4. PATTERN: Is the pain constant? (e.g., yes, no; constant, intermittent)      Comes and goes 5. RADIATION: Does the pain shoot into your legs or somewhere else?     Sciatic pain to left leg 6. CAUSE:  What do you think is causing the back pain?      unsure 7. BACK OVERUSE:  Any recent lifting of heavy objects, strenuous work or exercise?     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 MEDICINES: What have you taken so far for the pain? (e.g., nothing, acetaminophen , NSAIDS)     Gabapentin , tylenol  arthritis 9. NEUROLOGIC SYMPTOMS: Do you have any weakness, numbness, or problems with bowel/bladder control?     Denies weakness, but does does have tingling down left leg 10. OTHER SYMPTOMS: Do you have any other symptoms? (e.g.,  fever, abdomen pain, burning with urination, blood in urine)       Frequent urination  Protocols used: Back Pain-A-AH

## 2024-03-27 ENCOUNTER — Ambulatory Visit: Admitting: Family Medicine

## 2024-04-17 ENCOUNTER — Other Ambulatory Visit: Payer: Self-pay

## 2024-04-17 ENCOUNTER — Telehealth: Payer: Self-pay

## 2024-04-17 ENCOUNTER — Ambulatory Visit: Admitting: Family Medicine

## 2024-04-17 DIAGNOSIS — D72810 Lymphocytopenia: Secondary | ICD-10-CM

## 2024-04-17 DIAGNOSIS — R5383 Other fatigue: Secondary | ICD-10-CM

## 2024-04-17 DIAGNOSIS — E782 Mixed hyperlipidemia: Secondary | ICD-10-CM

## 2024-04-17 NOTE — Telephone Encounter (Signed)
 Copied from CRM #[id number]. Topic: Clinical - Request for Lab/Test Order >> Apr 17, 2024  7:43 AM Carlyon D wrote: Reason for CRM: Pt wife is calling asking for pt labs to be entered that were previously discussed that they were going to re due his lab work.  Please call to schedule when orders are entered.  Called and spoke with wife about labs she wil bring pt by next week and lab orders are put in

## 2024-05-01 ENCOUNTER — Ambulatory Visit: Admitting: Internal Medicine

## 2024-08-21 ENCOUNTER — Encounter: Admitting: Internal Medicine
